# Patient Record
Sex: Female | Born: 1935 | Race: Black or African American | Hispanic: No | Smoking: Never smoker
Health system: Southern US, Community
[De-identification: ages and names within clinical notes are randomized; demographics above are authoritative.]

## PROBLEM LIST (undated history)

## (undated) DIAGNOSIS — N821 Other female urinary-genital tract fistulae: Secondary | ICD-10-CM

## (undated) DIAGNOSIS — R1310 Dysphagia, unspecified: Secondary | ICD-10-CM

## (undated) DIAGNOSIS — K3533 Acute appendicitis with perforation and localized peritonitis, with abscess: Secondary | ICD-10-CM

## (undated) DIAGNOSIS — E1139 Type 2 diabetes mellitus with other diabetic ophthalmic complication: Secondary | ICD-10-CM

## (undated) DIAGNOSIS — E1149 Type 2 diabetes mellitus with other diabetic neurological complication: Secondary | ICD-10-CM

## (undated) DIAGNOSIS — S82209A Unspecified fracture of shaft of unspecified tibia, initial encounter for closed fracture: Secondary | ICD-10-CM

## (undated) DIAGNOSIS — N319 Neuromuscular dysfunction of bladder, unspecified: Secondary | ICD-10-CM

## (undated) DIAGNOSIS — K5989 Other specified functional intestinal disorders: Secondary | ICD-10-CM

## (undated) DIAGNOSIS — G35 Multiple sclerosis: Secondary | ICD-10-CM

## (undated) DIAGNOSIS — I1310 Hypertensive heart and chronic kidney disease without heart failure, with stage 1 through stage 4 chronic kidney disease, or unspecified chronic kidney disease: Secondary | ICD-10-CM

## (undated) DIAGNOSIS — K598 Other specified functional intestinal disorders: Secondary | ICD-10-CM

## (undated) DIAGNOSIS — N219 Calculus of lower urinary tract, unspecified: Secondary | ICD-10-CM

## (undated) DIAGNOSIS — N183 Chronic kidney disease, stage 3 unspecified: Secondary | ICD-10-CM

## (undated) DIAGNOSIS — S72009A Fracture of unspecified part of neck of unspecified femur, initial encounter for closed fracture: Secondary | ICD-10-CM

## (undated) DIAGNOSIS — Z86711 Personal history of pulmonary embolism: Secondary | ICD-10-CM

## (undated) DIAGNOSIS — G8220 Paraplegia, unspecified: Secondary | ICD-10-CM

## (undated) DIAGNOSIS — F22 Delusional disorders: Secondary | ICD-10-CM

## (undated) DIAGNOSIS — M3500 Sicca syndrome, unspecified: Secondary | ICD-10-CM

## (undated) HISTORY — DX: Other female urinary-genital tract fistulae: N82.1

## (undated) HISTORY — DX: Chronic kidney disease, stage 3 (moderate): N18.3

## (undated) HISTORY — DX: Chronic kidney disease, stage 3 unspecified: N18.30

## (undated) HISTORY — DX: Other specified functional intestinal disorders: K59.89

## (undated) HISTORY — DX: Dysphagia, unspecified: R13.10

## (undated) HISTORY — DX: Delusional disorders: F22

## (undated) HISTORY — DX: Neuromuscular dysfunction of bladder, unspecified: N31.9

## (undated) HISTORY — DX: Multiple sclerosis: G35

## (undated) HISTORY — DX: Fracture of unspecified part of neck of unspecified femur, initial encounter for closed fracture: S72.009A

## (undated) HISTORY — DX: Hypertensive heart and chronic kidney disease without heart failure, with stage 1 through stage 4 chronic kidney disease, or unspecified chronic kidney disease: I13.10

## (undated) HISTORY — DX: Other specified functional intestinal disorders: K59.8

## (undated) HISTORY — DX: Sjogren syndrome, unspecified: M35.00

## (undated) HISTORY — DX: Acute appendicitis with perforation, localized peritonitis, and gangrene, with abscess: K35.33

## (undated) HISTORY — DX: Paraplegia, unspecified: G82.20

## (undated) HISTORY — DX: Personal history of pulmonary embolism: Z86.711

## (undated) HISTORY — DX: Type 2 diabetes mellitus with other diabetic neurological complication: E11.49

## (undated) HISTORY — DX: Unspecified fracture of shaft of unspecified tibia, initial encounter for closed fracture: S82.209A

## (undated) HISTORY — DX: Type 2 diabetes mellitus with other diabetic ophthalmic complication: E11.39

## (undated) HISTORY — DX: Calculus of lower urinary tract, unspecified: N21.9

---

## 1999-08-27 ENCOUNTER — Encounter: Payer: Self-pay | Admitting: Internal Medicine

## 1999-08-27 ENCOUNTER — Ambulatory Visit (HOSPITAL_COMMUNITY): Admission: RE | Admit: 1999-08-27 | Discharge: 1999-08-27 | Payer: Self-pay | Admitting: Internal Medicine

## 1999-09-01 ENCOUNTER — Encounter: Admission: RE | Admit: 1999-09-01 | Discharge: 1999-11-30 | Payer: Self-pay | Admitting: Orthopedic Surgery

## 1999-09-11 ENCOUNTER — Encounter: Payer: Self-pay | Admitting: Urology

## 1999-09-11 ENCOUNTER — Encounter: Admission: RE | Admit: 1999-09-11 | Discharge: 1999-09-11 | Payer: Self-pay | Admitting: Urology

## 2000-08-23 ENCOUNTER — Encounter: Admission: RE | Admit: 2000-08-23 | Discharge: 2000-09-06 | Payer: Self-pay | Admitting: Neurology

## 2000-08-27 ENCOUNTER — Ambulatory Visit (HOSPITAL_COMMUNITY): Admission: RE | Admit: 2000-08-27 | Discharge: 2000-08-27 | Payer: Self-pay | Admitting: Internal Medicine

## 2000-08-27 ENCOUNTER — Encounter: Payer: Self-pay | Admitting: Internal Medicine

## 2001-04-01 ENCOUNTER — Encounter: Admission: RE | Admit: 2001-04-01 | Discharge: 2001-04-01 | Payer: Self-pay | Admitting: Internal Medicine

## 2001-04-07 ENCOUNTER — Encounter: Admission: RE | Admit: 2001-04-07 | Discharge: 2001-04-07 | Payer: Self-pay | Admitting: Internal Medicine

## 2001-05-06 ENCOUNTER — Encounter: Admission: RE | Admit: 2001-05-06 | Discharge: 2001-05-06 | Payer: Self-pay | Admitting: Internal Medicine

## 2001-05-24 ENCOUNTER — Encounter: Admission: RE | Admit: 2001-05-24 | Discharge: 2001-05-24 | Payer: Self-pay | Admitting: Internal Medicine

## 2001-06-27 ENCOUNTER — Encounter (HOSPITAL_COMMUNITY): Admission: RE | Admit: 2001-06-27 | Discharge: 2001-09-25 | Payer: Self-pay | Admitting: Internal Medicine

## 2001-09-08 ENCOUNTER — Encounter: Payer: Self-pay | Admitting: Urology

## 2001-09-08 ENCOUNTER — Ambulatory Visit (HOSPITAL_COMMUNITY): Admission: RE | Admit: 2001-09-08 | Discharge: 2001-09-08 | Payer: Self-pay | Admitting: Internal Medicine

## 2002-09-20 ENCOUNTER — Encounter: Payer: Self-pay | Admitting: Internal Medicine

## 2002-09-20 ENCOUNTER — Ambulatory Visit (HOSPITAL_COMMUNITY): Admission: RE | Admit: 2002-09-20 | Discharge: 2002-09-20 | Payer: Self-pay | Admitting: Internal Medicine

## 2002-10-15 ENCOUNTER — Encounter: Payer: Self-pay | Admitting: Emergency Medicine

## 2002-10-16 ENCOUNTER — Inpatient Hospital Stay (HOSPITAL_COMMUNITY): Admission: EM | Admit: 2002-10-16 | Discharge: 2002-10-20 | Payer: Self-pay | Admitting: Emergency Medicine

## 2003-02-05 ENCOUNTER — Encounter (HOSPITAL_COMMUNITY): Admission: RE | Admit: 2003-02-05 | Discharge: 2003-05-06 | Payer: Self-pay | Admitting: Internal Medicine

## 2003-06-05 ENCOUNTER — Ambulatory Visit (HOSPITAL_COMMUNITY): Admission: RE | Admit: 2003-06-05 | Discharge: 2003-06-05 | Payer: Self-pay | Admitting: General Surgery

## 2003-06-05 ENCOUNTER — Encounter: Admission: RE | Admit: 2003-06-05 | Discharge: 2003-06-05 | Payer: Self-pay | Admitting: General Surgery

## 2003-06-20 ENCOUNTER — Ambulatory Visit (HOSPITAL_COMMUNITY): Admission: RE | Admit: 2003-06-20 | Discharge: 2003-06-20 | Payer: Self-pay | Admitting: Internal Medicine

## 2004-04-23 ENCOUNTER — Ambulatory Visit (HOSPITAL_COMMUNITY): Admission: RE | Admit: 2004-04-23 | Discharge: 2004-04-23 | Payer: Self-pay | Admitting: Internal Medicine

## 2004-06-14 ENCOUNTER — Inpatient Hospital Stay (HOSPITAL_COMMUNITY): Admission: EM | Admit: 2004-06-14 | Discharge: 2004-06-14 | Payer: Self-pay

## 2004-10-05 ENCOUNTER — Emergency Department (HOSPITAL_COMMUNITY): Admission: EM | Admit: 2004-10-05 | Discharge: 2004-10-05 | Payer: Self-pay | Admitting: Emergency Medicine

## 2004-10-06 ENCOUNTER — Inpatient Hospital Stay (HOSPITAL_COMMUNITY): Admission: EM | Admit: 2004-10-06 | Discharge: 2004-10-14 | Payer: Self-pay | Admitting: Emergency Medicine

## 2004-10-21 ENCOUNTER — Inpatient Hospital Stay (HOSPITAL_COMMUNITY): Admission: AD | Admit: 2004-10-21 | Discharge: 2004-10-27 | Payer: Self-pay | Admitting: Internal Medicine

## 2004-10-22 ENCOUNTER — Encounter: Payer: Self-pay | Admitting: Cardiology

## 2004-10-22 ENCOUNTER — Ambulatory Visit: Payer: Self-pay | Admitting: Cardiology

## 2005-03-06 ENCOUNTER — Ambulatory Visit (HOSPITAL_COMMUNITY): Admission: RE | Admit: 2005-03-06 | Discharge: 2005-03-06 | Payer: Self-pay | Admitting: Internal Medicine

## 2007-10-28 ENCOUNTER — Inpatient Hospital Stay (HOSPITAL_COMMUNITY): Admission: EM | Admit: 2007-10-28 | Discharge: 2007-11-03 | Payer: Self-pay | Admitting: Emergency Medicine

## 2007-11-02 ENCOUNTER — Encounter: Payer: Self-pay | Admitting: Gastroenterology

## 2007-11-09 ENCOUNTER — Encounter: Payer: Self-pay | Admitting: Gastroenterology

## 2007-11-29 ENCOUNTER — Ambulatory Visit (HOSPITAL_COMMUNITY): Admission: RE | Admit: 2007-11-29 | Discharge: 2007-11-29 | Payer: Self-pay | Admitting: *Deleted

## 2007-11-29 ENCOUNTER — Ambulatory Visit: Payer: Self-pay | Admitting: Vascular Surgery

## 2007-12-01 ENCOUNTER — Emergency Department (HOSPITAL_COMMUNITY): Admission: EM | Admit: 2007-12-01 | Discharge: 2007-12-01 | Payer: Self-pay | Admitting: Emergency Medicine

## 2007-12-18 ENCOUNTER — Emergency Department (HOSPITAL_COMMUNITY): Admission: EM | Admit: 2007-12-18 | Discharge: 2007-12-18 | Payer: Self-pay | Admitting: Emergency Medicine

## 2008-05-26 ENCOUNTER — Inpatient Hospital Stay (HOSPITAL_COMMUNITY): Admission: EM | Admit: 2008-05-26 | Discharge: 2008-05-28 | Payer: Self-pay | Admitting: Emergency Medicine

## 2008-05-28 ENCOUNTER — Encounter: Payer: Self-pay | Admitting: Gastroenterology

## 2009-03-06 ENCOUNTER — Encounter: Payer: Self-pay | Admitting: Gastroenterology

## 2009-03-07 ENCOUNTER — Encounter: Payer: Self-pay | Admitting: Gastroenterology

## 2009-03-20 ENCOUNTER — Encounter: Payer: Self-pay | Admitting: Gastroenterology

## 2009-04-10 ENCOUNTER — Encounter: Payer: Self-pay | Admitting: Gastroenterology

## 2009-04-11 ENCOUNTER — Encounter: Payer: Self-pay | Admitting: Gastroenterology

## 2009-04-15 ENCOUNTER — Ambulatory Visit (HOSPITAL_COMMUNITY): Admission: RE | Admit: 2009-04-15 | Discharge: 2009-04-15 | Payer: Self-pay | Admitting: Internal Medicine

## 2009-04-15 ENCOUNTER — Encounter: Payer: Self-pay | Admitting: Gastroenterology

## 2009-04-15 ENCOUNTER — Encounter (INDEPENDENT_AMBULATORY_CARE_PROVIDER_SITE_OTHER): Payer: Self-pay | Admitting: *Deleted

## 2009-04-19 ENCOUNTER — Ambulatory Visit: Payer: Self-pay | Admitting: Gastroenterology

## 2009-04-19 DIAGNOSIS — I6350 Cerebral infarction due to unspecified occlusion or stenosis of unspecified cerebral artery: Secondary | ICD-10-CM | POA: Insufficient documentation

## 2009-04-19 DIAGNOSIS — G35 Multiple sclerosis: Secondary | ICD-10-CM

## 2009-04-19 DIAGNOSIS — K315 Obstruction of duodenum: Secondary | ICD-10-CM | POA: Insufficient documentation

## 2009-05-27 ENCOUNTER — Ambulatory Visit: Payer: Self-pay | Admitting: Gastroenterology

## 2009-05-27 DIAGNOSIS — R1084 Generalized abdominal pain: Secondary | ICD-10-CM | POA: Insufficient documentation

## 2009-12-15 IMAGING — CR DG CHEST 1V
1 series · 1 of 1 positions shown · non-contrast
Comparison: 10/25/2004

CLINICAL DATA: Short of breath and cough

CHEST - 1 VIEW

[view not recorded]
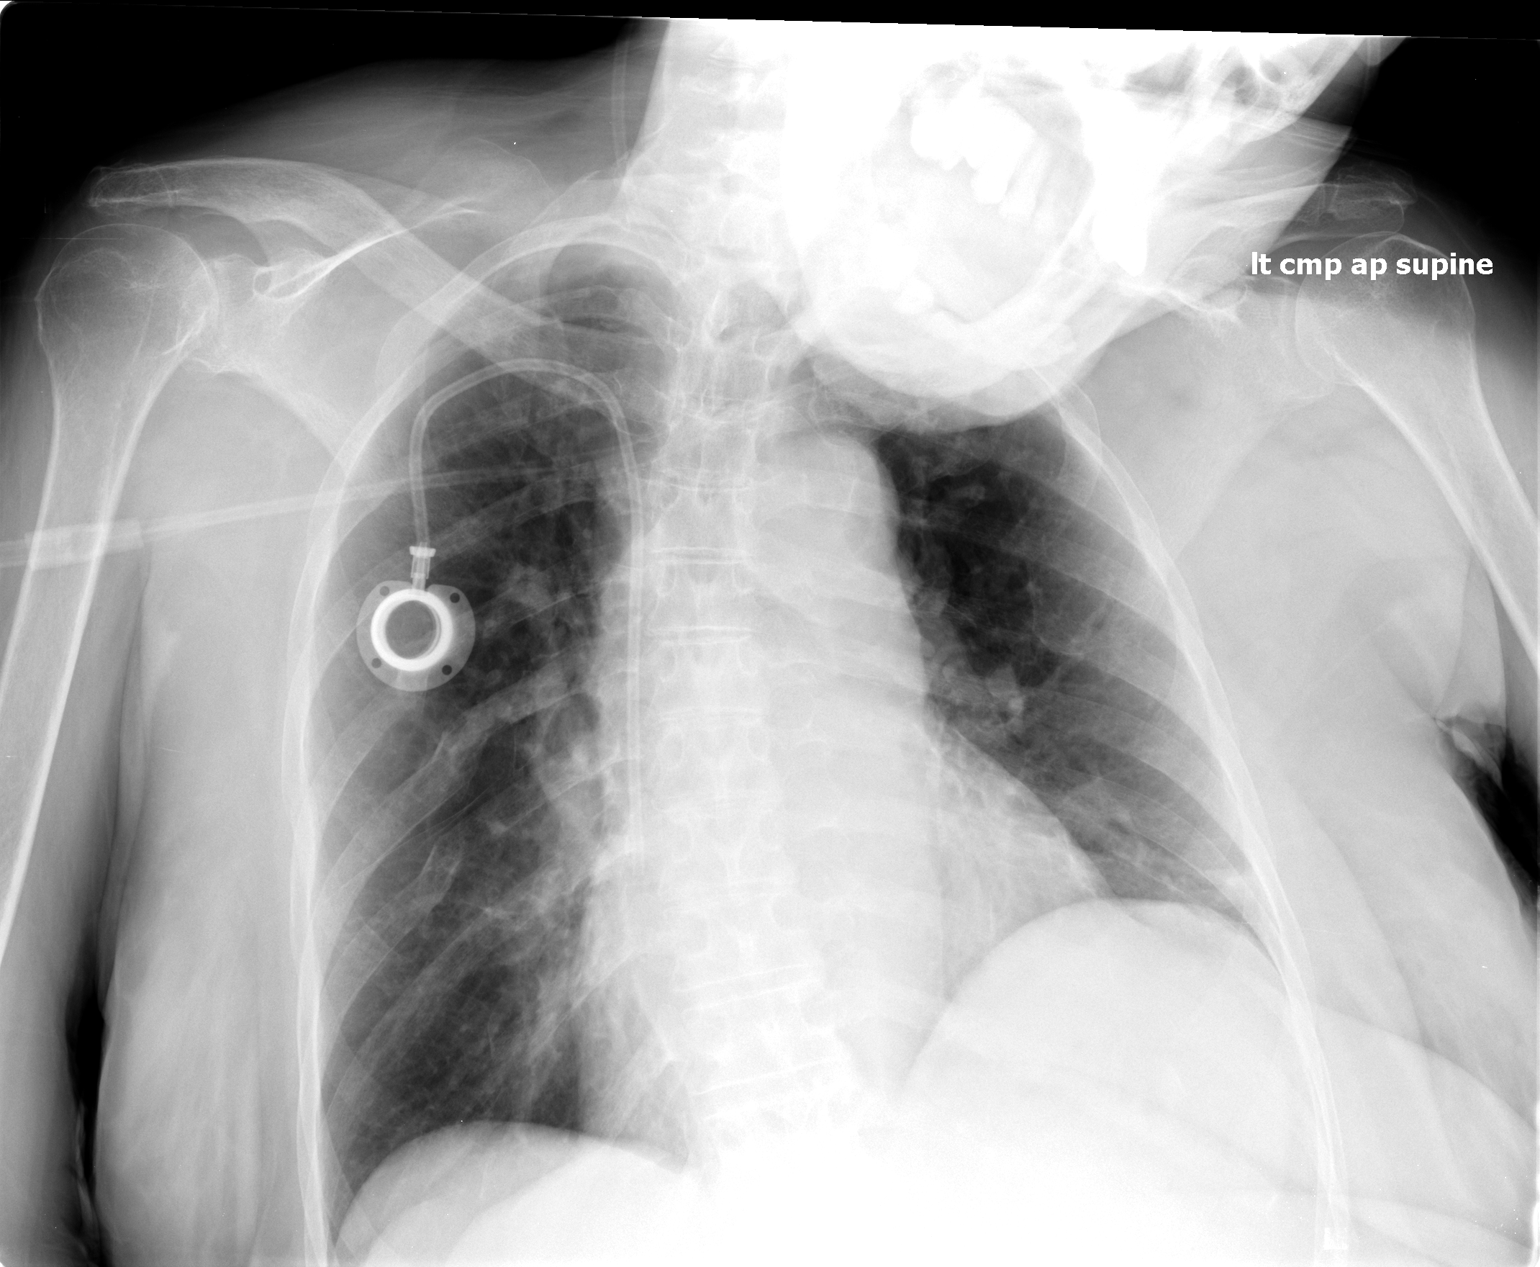

[1 of 1 positions shown; findings below may reference images not displayed]

FINDINGS: There is stable positioning of the right chest Port-A-
Cath.  Atelectasis/scarring present at both lung bases.  No overt
infiltrate is seen.  No evidence of pulmonary edema.  Chronic
elevation of the left hemidiaphragm.  No pleural effusions are
visualized.
IMPRESSION: Bibasilar scarring/atelectasis.  No focal infiltrate is identified.

## 2010-05-04 ENCOUNTER — Emergency Department (HOSPITAL_COMMUNITY): Admission: EM | Admit: 2010-05-04 | Discharge: 2010-05-04 | Payer: Self-pay | Admitting: Emergency Medicine

## 2010-10-08 LAB — DIFFERENTIAL
Basophils Absolute: 0 10*3/uL (ref 0.0–0.1)
Basophils Relative: 1 % (ref 0–1)
Eosinophils Relative: 4 % (ref 0–5)
Lymphocytes Relative: 30 % (ref 12–46)
Monocytes Absolute: 0.7 10*3/uL (ref 0.1–1.0)
Neutro Abs: 4.7 10*3/uL (ref 1.7–7.7)

## 2010-10-08 LAB — URINE CULTURE
Colony Count: >=100000
Culture  Setup Time: 201110092346

## 2010-10-08 LAB — COMPREHENSIVE METABOLIC PANEL
AST: 28 U/L (ref 0–37)
Albumin: 3.1 g/dL — ABNORMAL LOW (ref 3.5–5.2)
Alkaline Phosphatase: 69 U/L (ref 39–117)
BUN: 8 mg/dL (ref 6–23)
CO2: 25 mEq/L (ref 19–32)
Chloride: 104 mEq/L (ref 96–112)
Creatinine, Ser: 0.47 mg/dL (ref 0.4–1.2)
GFR calc non Af Amer: >60 mL/min (ref >60)
Potassium: 3.9 mEq/L (ref 3.5–5.1)
Total Bilirubin: 0.3 mg/dL (ref 0.3–1.2)

## 2010-10-08 LAB — CBC
HCT: 39.3 % (ref 36.0–46.0)
Hemoglobin: 12.6 g/dL (ref 12.0–15.0)
MCH: 26.3 pg (ref 26.0–34.0)
MCV: 82 fL (ref 78.0–100.0)
RBC: 4.79 MIL/uL (ref 3.87–5.11)
WBC: 8.2 10*3/uL (ref 4.0–10.5)

## 2010-10-08 LAB — URINE MICROSCOPIC-ADD ON

## 2010-10-08 LAB — URINALYSIS, ROUTINE W REFLEX MICROSCOPIC
Bilirubin Urine: NEGATIVE
Glucose, UA: NEGATIVE mg/dL
Ketones, ur: NEGATIVE mg/dL
Protein, ur: NEGATIVE mg/dL
pH: 6.5 (ref 5.0–8.0)

## 2010-10-08 LAB — PROTIME-INR: Prothrombin Time: 19.1 seconds — ABNORMAL HIGH (ref 11.6–15.2)

## 2010-12-09 NOTE — H&P (Signed)
NAMESTEPHANIE, [name] NO.:  [id number]   MEDICAL RECORD NO.:  [id number]          PATIENT TYPE:  EMS   LOCATION:  MAJO                         FACILITY:  [facility]   PHYSICIAN:  Eduard Clos, MDDATE OF BIRTH:  24-Jul-1936   DATE OF ADMISSION:  10/27/2007  DATE OF DISCHARGE:                              HISTORY & PHYSICAL   CHIEF COMPLAINT:  Fever.   HISTORY OF PRESENT ILLNESS:  A 75 year old female with known history of  multiple sclerosis, was brought into the ER.  The patient was having  fever the last 2-3 days, and the patient was examined and was felt by  the doctors at her facility that she might have pneumonia and was  transferred here for extended time in the hospital did not show any  acute infiltrates, and the patient's UA was compatible with ear  infection, and thus was admitted for further workup and management.   The patient denies any chest pain, shortness of breath, dizziness.  Denies any abdominal pain, nausea, vomiting, no diarrhea.  The patient  did have some sputum for which she was given enema in the facility she  is from.  Denies any abdominal pain.   PAST MEDICAL HISTORY:  Multiple sclerosis and chronic pain.   PAST SURGICAL HISTORY:  None.   MEDICATIONS PRIOR TO ADMISSION:  1. Baclofen 10 mg p.o. three times a day.  2. Fluoxetine 20 mg per day.  3. Lidoderm 5% patch to the lower right knee.  4. Lasix 40 mg p.o. daily.  5. K-Ciel 20 mEq p.o. daily.  6. Tylenol as needed.  7. Coumadin for DVT prophylaxis per pharmacy.   ALLERGIES:  SULFA.   FAMILY HISTORY:  Noncontributory.   SOCIAL HISTORY:  The patient lives in [place].  Denies smoking cigarettes,  drinking alcohol or using any illegal drugs.   REVIEW OF SYSTEMS:  As per history of present illness, otherwise,  nothing significant.   PHYSICAL EXAMINATION:  Patient examined at bedside, is in no acute  distress.  VITAL SIGNS:  Blood pressure 106/73, pulse 90 per minute,  temperature  max 101.4, respirations 18, O2 saturation 98%.  HEENT:  Nonicteric, no pallor.  CHEST:  Bilateral air entry present.  No rhonchi.  No crepitus.  HEART: S1 S2 heard.  ABDOMEN:  Slightly distended.  Nontender.  Bowel sounds heard.  No  guarding or rigidity.  CNS:  Alert and oriented to time, place and person.   The patient has complete hemiplegia of the right side with mild weakness  at the left side.  On both of the lower extremities, the right has  hardly any movement, and the left has minimal movements.  The patient  has chronic indwelling catheter.  EXTREMITIES:  Peripheral pulses felt.  No edema.   LABORATORY DATA:  Chest x-ray:  Bibasilar scarring.  No infiltrates  identified.   CBC:  WBC is 19.7, hemoglobin 12.6, hematocrit 37, platelets 204,  neutrophils 88%.  PT/INR 32.4 and 3.  Basic metabolic panel:  Sodium  137, potassium 4.2, chloride 103, glucose 102, BUN 12, creatinine 0.9.  Urinalysis:  Appearance is cloudy with blood,  protein 100, nitrites  positive, leukocytes large.  WBC 11-12, bacteria many.   ASSESSMENT:  1. Fever with possible sepsis secondary to UTI.  2. Multiple sclerosis.  3. Mild dehydration.  4. Chronic pain.   PLAN:  1. Admit patient to medical floor.  Start empiric antibiotics.  Follow      blood cultures and urine cultures.  Placing on patient's home      medications.  2. Other recommendations, as patient's condition evolves.      Eduard Clos, MD  Electronically Signed     ANK/MEDQ  D:  10/28/2007  T:  10/28/2007  Job:  [id number]

## 2010-12-09 NOTE — Discharge Summary (Signed)
NAMEDONIE, [name] NO.:  [id number]   MEDICAL RECORD NO.:  [id number]          PATIENT TYPE:  INP   LOCATION:  5531                         FACILITY:  [facility]   PHYSICIAN:  Hillery Aldo, M.D.   DATE OF BIRTH:  1935/11/03   DATE OF ADMISSION:  05/26/2008  DATE OF DISCHARGE:  05/28/2008                               DISCHARGE SUMMARY   PRIMARY CARE PHYSICIAN:  Dr. Jacalyn Lefevre with [facility].   DISCHARGE DIAGNOSES:  1. Febrile illness.  2. Polymicrobial urinary tract infection.  3. Multiple sclerosis.  4. Quadriparesis.  5. Hypotension, resolved.  6. Mild hypokalemia.  7. Leukocytosis.   DISCHARGE MEDICATIONS:  1. Ceftin 500 mg p.o. b.i.d. times 5 days.  2. Oxybutynin 5 mg q.a.c. hours p.o.  3. Protonix 40 mg p.o. daily.  4. Potassium ER 20 mEq p.o. daily.  5. Coumadin 4.5 mg p.o. daily.  6. Multivitamin p.o. daily.  7. Med Pass 60 mL t.i.d.  8. Refresh eye drops one drop in each eye q.h.s.   CONSULTATIONS:  None.   BRIEF ADMISSION HISTORY OF PRESENT ILLNESS:  The patient is a 75-year-  old female sent from her nursing department because of fever.  The  patient her self had no complaints and was unsure why she was sent to  the emergency department.  Upon initial evaluation, she was febrile and  had a leukocytosis and therefore was admitted for further evaluation and  workup.  For full details, please see the dictated report done by Dr.  Gasper Sells.   PROCEDURES AND DIAGNOSTIC STUDIES:  Chest x-ray on May 26, 2008,  showed a stable appearance with no evidence of acute cardiopulmonary  disease.  Chronic elevation of left hemidiaphragm and mild left basilar  atelectasis or left basilar scarring.   DISCHARGE LABORATORY VALUES:  Sodium was 138, potassium 4.4, chloride  110, bicarb 22, BUN 7, creatinine 0.5, glucose 84.  White blood cell  count was 10.9, hemoglobin 10.9, hematocrit 32.0, platelets 206.   HOSPITAL COURSE:  1. Febrile  illness.  The patient did have blood cultures drawn and one      of the two cultures was positive for coagulase negative staph.      This was felt to be a contaminant in that the other culture was      negative.  While pending the culture results, the patient was      transiently put on vancomycin.  Felt that her fever was probably      due to her urinary tract infection.  The patient had defervesced      over the past 24 hours and is now complaining of some mild upper      respiratory symptoms and she may have a concurrent viral upper      respiratory infection.  This is currently mild and is not      consistent with influenza, and therefore, Tamiflu has not been      started.  2. Polymicrobial urinary tract infection.  The patient's urine      cultures grew out multiple bacterial morphotypes.  Currently on day      two of Rocephin and we will discharge her on an additional 5 days      of treatment with Ceftin.  She should have her urine cultures      repeated after completion of antibiotics.  3. Multiple sclerosis with quadriparesis.  The patient is at high risk      for skin breakdown and problems related to her relative immobility.      Her skin was examined closely and no signs of decubitus ulcers were      found.  She did have some mild erythema to the sacral area, and we      do recommend ongoing efforts at pressure reduction.  4. Hypotension.  The patient was hydrated and put on empiric      antibiotic therapy.  At this time, her discharge blood pressure is      128/73.  5. Mild hypokalemia.  The patient was appropriately repleted and her      discharge potassium is 4.4.  6. Leukocytosis.  The patient's admission white blood cell count was      18.6.  It is currently 10.9.   DISPOSITION:  The patient feels well and is without complaints other  than some mild rhinorrhea.  She denies pharyngitis and arthralgias.  It  is felt that her fever and hypotension were likely due to be  urinary  tract infection, and she will be discharged on an additional 5 days of  treatment with Ceftin.  She should follow up with her primary care  physician after completion of antibiotics for repeat urinalysis with  culture.   Time spent coordinating care for discharge equals 30 minutes.      Hillery Aldo, M.D.  Electronically Signed     CR/MEDQ  D:  05/28/2008  T:  05/28/2008  Job:  [id number]   cc:   Frederica Kuster, MD

## 2010-12-09 NOTE — Discharge Summary (Signed)
[name], [name] NO.:  [id number]   MEDICAL RECORD NO.:  [id number]          PATIENT TYPE:  INP   LOCATION:  6703                         FACILITY:  [facility]   PHYSICIAN:  Herbie Saxon, MDDATE OF BIRTH:  Apr 27, 1936   DATE OF ADMISSION:  10/28/2007  DATE OF DISCHARGE:  11/03/2007                               DISCHARGE SUMMARY   ADDENDUM   This is an addendum to the Discharge Summary previously dictated by Dr.  Jerolyn Center on November 01, 2007.   DISPOSITION:  [facility].   FINAL DIAGNOSES:  Remains the same.   DISCHARGE MEDICATIONS:  Please add:   Rocephin 1 g IV daily for five more days.  The patient is to have a  repeat complete blood count November 04, 2007 at the nursing facility.  She  is to have PT/INR monitored weekly for two weeks.  The goal INR should  be 2-3 for DVT prophylaxis.  The primary care doctor at [facility]  nursing home to titrate her PT and INR as outpatient and watch for  bleeding episodes.  The patient will also be on strict comatose  precautions, air mattress.   PHYSICAL EXAMINATION:  GENERAL:  She is an elderly lady not in acute  respiratory distress.  She is anxious, quadriparetic with pupils equal,  reactive to light accommodation; involuntary and incongruent speech,  mildly pale, not jaundiced.  NECK:  Supple.  She has quadriparesis.  CHEST:  Clear.  HEART:  Heart sounds 1 and 2, regular rate and rhythm.  ABDOMEN:  Benign.  NEUROLOGICAL:  She is alert, demented.  EXTREMITIES:  Contractures of her lower limbs.  Peripheral pulses  present, no pedal edema.   LABORATORY DATA:  WBC 13.5, hematocrit 31, platelet count 352.  PPT 26,  INR 2.3.   DISCHARGE CONDITION:  Stable.   She is to have a position change in bed every 2-3 hourly.  Patient is to  have a repeat urinalysis in five days' time.      Herbie Saxon, MD  Electronically Signed     MIO/MEDQ  D:  11/03/2007  T:  11/03/2007  Job:  [id number]

## 2010-12-09 NOTE — Discharge Summary (Signed)
[name], [name] NO.:  [id number]   MEDICAL RECORD NO.:  [id number]          PATIENT TYPE:  INP   LOCATION:  6703                         FACILITY:  [facility]   PHYSICIAN:  Altha Harm, MDDATE OF BIRTH:  [birth year]   DATE OF ADMISSION:  10/28/2007  DATE OF DISCHARGE:  11/02/2007                               DISCHARGE SUMMARY   DISCHARGE DISPOSITION:  Nursing home.   FINAL DISCHARGE DIAGNOSES:  1. Urinary tract infection with sepsis.  2. Providencia stuartii in urine.  3. Multiple sclerosis  4. Chronic pain.  5. Effective quadriparesis.  6. Neurogenic bladder.  7. Left torticollis.  8  Chronic suprapubic catheter.  1. Chronic ileus.  2. History of  left hip fracture.  3. History of right tibia fracture.  4. Paranoia.  5. Gastroesophageal reflux disease.  6. History of chronic depression.  7. Status post cath placement on the right side.   DISCHARGE MEDICATIONS:  1. Baclofen 10 mg p.o. t.i.d.  2. Prozac 20 mg p.o. daily.  3. Lasix 40 mg p.o. daily.  4. Neurontin 200 mg p.o. q.h.s.  5. Multivitamin 1 tablet p.o. daily.  6. Ditropan 5 mg p.o. q.8 h.  7. Protonix 40 mg p.o. daily.  8. Potassium chloride 10 mEq p.o. daily.  9. Lidocaine 5% transdermal patch applied daily.  10.Ensure 1 can p.o. t.i.d. p.r.n.  11.Lactulose 30 mL p.o. daily.  12.Coumadin to 1 mg p.o. daily.   CONSULTANTS:  None.   PROCEDURES:  None.   DIAGNOSTIC STUDIES:  Chest x-ray one view done on admission which shows  bibasilar scarring or atelectasis.  No focal infiltrate.   PERTINENT LABORATORY STUDIES:  Urinary culture shows Providencia  stuartii which is highly resistant, sensitive only to ceftriaxone,  Zosyn,aztreonam, imipenem, ceftazidime.   CHIEF COMPLAINT:  Change in mental status and fever.   HISTORY OF PRESENT ILLNESS:  Please see the H&P dictated by Dr.  Toniann Fail, for details of the HPI.   HOSPITAL COURSE:  1. The patient was admitted and started on  ciprofloxacin and      vancomycin, for her urinary tract infection.  She was continued on      this, until sensitivities were received.  Sensitivity showed that      this was sensitive to neither medication, and the patient was      switched over ceftriaxone.  The patient is to continue a total of 9      days of ceftriaxone, to complete a full course of 10 days for UTI      with pyelonephritis caused by Providencia stuartii.  2. Indwelling suprapubic catheter.  The patient had leakage around her      suprapubic catheter.  Attempts were made to treat it with Ditropan;      however, these were unsuccessful.  The catheter was replaced, which      caused some transient hematuria.  The new catheter was irrigated,      and the patient continued to have a clear flow of urine.  3. Chronic Coumadin therapy.  The patient in on chronic Coumadin  therapy, due to her immobility.  Her INR was elevated today at 3.7.      Coumadin will be held tonight and will check in INR in the morning.      The discharging physician should make recommendations on the dose      of Coumadin that the patient should take.  Otherwise, the patient      is continued on her usual medications.  She is otherwise stable, to      be transferred back to the nursing home in the morning.   PHYSICAL EXAMINATION:  Her temperature today was 98.5, heart rate 65,  blood pressure 111/68, respiratory rate of 60, O2 sat of 99% on 2  liters.  The patient has no complaints.  She is at her baseline, and  subsequently she will  patient transferred to the nursing home in the  morning.      Altha Harm, MD  Electronically Signed     MAM/MEDQ  D:  11/01/2007  T:  11/01/2007  Job:  [id number]   cc:   [facility]

## 2010-12-09 NOTE — H&P (Signed)
[name], Crosby                 ACCOUNT NO.:  [id number]   MEDICAL RECORD NO.:  [id number]          PATIENT TYPE:  INP   LOCATION:  5531                         FACILITY:  [facility]   PHYSICIAN:  Thomasenia Bottoms, MDDATE OF BIRTH:  1935-09-07   DATE OF ADMISSION:  05/26/2008  DATE OF DISCHARGE:                              HISTORY & PHYSICAL   CHIEF COMPLAINT:  Fever.   HISTORY OF PRESENT ILLNESS:  [name] is a 75 year old woman who was  sent in from the nursing home today because of a temperature of 102.  The patient says she felt fine and was not sure at all why she was sent  to the hospital, but this is per the ER records.  The patient is not  able to provide any other history.  Her past medical history is  significant for multiple sclerosis, which was diagnosed in the 73s.  She is currently bed bound as the effect of quadriparesis, history of  the decubitus ulcers.  She has a history of UTI and sepsis that was the  reason for last hospitalization here which was in April 2009.  She has a  history of GERD, depression, history of left hip fracture and right  tibial fracture, and history of chronic illness.  She has a suprapubic  catheter in place because of history of neurogenic bladder.  She also  has a Port-A-Cath in place and she is on chronic anticoagulation.  The  records in the computer say this is for DVT prophylaxis.  I do not see  any other indication listed.  Her medications on arrival include  warfarin 4.5 mg p.o. daily, multivitamin daily, Med Pass 60 mL three  times daily, and sorbitol 30 mL daily.  She gets Refresh eyedrops 1 drop  in each eye at night, potassium 20 mEq daily, pantoprazole 40 mg p.o.  daily, oxybutynin 5 mg every 8 hours.   SOCIAL HISTORY:  The patient does not smoke cigarettes, drink alcohol,  or use any illicit drugs.   FAMILY HISTORY:  Noncontributory.   REVIEW OF SYSTEMS:  The patient is not able to reliably provide vitals  on arrival  in the emergency department.  Her blood pressure was 83/60.  At the time of my evaluation, her systolic blood pressure was 105.  Her  initial pulse was 97, respiratory rate 20, O2 sats 95% on 2 liters nasal  cannula, and temperature on arrival here was 98.5.   PHYSICAL EXAMINATION:  GENERAL:  She is a chronically ill-appearing  woman in no acute distress.  She is pleasant and cooperative.  HEENT:  Normocephalic, atraumatic.  Pupils are equal in size and round.  Sclerae nonicteric.  Oral mucosa is quite dry.  NECK:  Supple.  No lymphadenopathy, no thyromegaly, no jugular venous  distention.  CARDIAC:  Regular rate and rhythm.  No murmurs, no gallops, no rubs.  LUNGS:  Clear to auscultation anteriorly.  No wheezes, no rhonchi, no  rales.  Good breath sounds.  ABDOMEN:  Distended but nontender.  She does have occasional breath  sounds.  No hepatosplenomegaly appreciated.  She does have a suprapubic  catheter.  She also has a right upper chest Port-A-Cath.  EXTREMITIES:  No evidence of pitting edema.  SKIN:  Intact.  No decubiti on her heels though her sacral area was not  examined.  NEUROLOGIC:  The patient is pleasant.  Her cranial nerves II through XII  are grossly intact.  She does not have movement of any of her  extremities except for some movement of the left upper extremity.  She  has bilateral foot drop and some muscle wasting in the calf area.  MUSCULOSKELETAL:  No effusions, but she has multiple contractures most  prominently in her right wrist and hand and elbow.  Please note that it  is her left arm that she is able to move voluntarily, not her right.   LABORATORY DATA:  The patient's sodium is 137, potassium 3.3, chloride  103, bicarb 26, glucose 109, BUN 15, creatinine 0.5.  Urinalysis reveals  turbid urine, large leukocyte esterase, negative nitrite, too numerous  to count wbc's, and 7-10 rbc's.  Her chest x-ray reveals no acute  cardiopulmonary disease, chronic left  hemidiaphragm elevation with mild  left basilar atelectasis.   ASSESSMENT/PLAN:  1. Urinary tract infection.  We will put the patient on ceftriaxone.      Her last hospitalization revealed a Providencia urinary tract      infection, which was resistant to Cipro and we will await her urine      culture.  2. Hypotension.  This has resolved with intravenous fluids in the      emergency department.  I suspect this is secondary to dehydration.      Given her clinical findings, we will put her on intravenous fluids      and follow.  3. Mild hypokalemia.  We will replace.  4. Otherwise we will continue her routine outpatient medications      including her daily Coumadin.  The records from several years ago      report that she was on Coumadin for deep vein thrombosis      prophylaxis.      Thomasenia Bottoms, MD  Electronically Signed     CVC/MEDQ  D:  05/26/2008  T:  05/26/2008  Job:  [id number]   cc:   Bertram Millard. Hyacinth Meeker, M.D.

## 2010-12-12 NOTE — Discharge Summary (Signed)
NAMEDELLE, ANDRZEJEWSKI NO.:  [id number]   MEDICAL RECORD NO.:  [id number]          PATIENT TYPE:  INP   LOCATION:  4735                         FACILITY:  [facility]   PHYSICIAN:  Lonia Blood, M.D.DATE OF BIRTH:  06/20/1936   DATE OF ADMISSION:  10/05/2004  DATE OF DISCHARGE:  10/14/2004                                 DISCHARGE SUMMARY   CHIEF COMPLAINT:  Shortness of breath.   DISCHARGE DIAGNOSES:  1.  Bilobar right lung pulmonary emboli.      1.  Computed tomography scan of the chest October 05, 2004, notes right          middle lobe and right upper lobe pulmonary emboli with bibasilar          atelectasis and ectatic thoracic aorta.      2.  Heparin to Coumadin transition at discharge.  INR actually          hypertherapeutic at 4.2.      3.  Nursing home pharmacy to follow Coumadin dosing and blood level.  2.  Pneumonia versus atelectasis.      1.  Chest x-ray, October 05, 2004, notes no acute abnormality with mild          peribronchial thickening and bibasilar scarring.      2.  Chest x-ray, October 08, 2004, notes interval worsening bilateral          basilar atelectasis and infiltrates.      3.  Chest x-ray, October 14, 2004, notes resolving bilateral lower lobe          infiltrates.      4.  Day #5 IV Zosyn, will transition to oral Augmentin to complete a          total 14-day course.  No obvious indication of aspiration as          indicated below.  3.  History of aspiration pneumonia/pneumonitis.  4.  History of gastroesophageal reflux disease.  5.  Sepsis per blood cultures.      1.  Blood cultures x2, October 08, 2004, both positive Staphylococcus          coagulase negative.      2.  Day #5 planned 14-day antibiotic therapy with vancomycin.  Nursing          home pharmacy to follow vancomycin dosing and blood levels.  Of          note, vancomycin trough mildly elevated 16.6 done on October 13, 2004.          Vancomycin dosing was subsequently changed  from twice daily to once          daily dosing.  Would recheck a trough level per discretion of the          following nursing home pharmacy.      3.  The patient also had positive blood cultures, June 14, 2004, x2          for Staphylococcus coagulase negative.  Suspicious existing Port-A-          Cath as  a possible source.  Will complete remaining 10 days of          vancomycin via Port-A-Cath.  Following completion of antibiotics          would draw two more blood cultures and followup.  If these remain          positive Staphylococcus coagulase negative I believe the patient          should have the Port-A-Cath removed.  6.  Chronic suprapubic catheter.      1.  Urine culture, October 08, 2004, produced no growth.  7.  Normocytic anemia.      1.  Hemoglobin in the upper 9 to 11 range over the course of          hospitalization with a likely component of hemodilution from IV          fluids.  No sign of acute bleed.      2.  One of three stool positive occult blood with no aggressive workup          initiated over the course of this hospitalization.      3.  Would recheck a CBC approximately two weeks postdischarge to follow.  8.  Hypokalemia, resolved over the course of hospitalization with potassium      replacement.  9.  End-stage multiple sclerosis.      1.  Affective quadriparesis.      2.  Chronic ileus.      3.  Neurogenic bladder with suprapubic catheter.      4.  History of neurogenic edema.      5.  History of torticollis.  10. Chronic constipation/ileus.      1.  Improved with use of p.r.n. Dulcolax suppository.  11. History of osteoporosis.      1.  Left hip fracture previously.      2.  Right tibial fracture previously.  12. History of paranoid ideation and likely baseline dementia.  13. Questionable history of osteomyelitis, right hip by history.  14. History of chronic depression.  15. Status post right chest Port-A-Cath.  16. Rule out dysphagia.      1.   Fiberoptic endoscopic evaluation of swallowing study, 10/14/04, notes          no aspiration and essentially normal swallowing function.  Some          delay in swallowing initiation.      2.  Recommended diet:  Dysphagia III with chopped meats and thin          liquids.  Full supervision and assist with feedings, meds whole in          applesauce or similar, eat and drink only when alert and seated          upright as much as possible.  Small bites and sips, chin tuck, turn          head to left during swallow which is the natural chronic head          position.  Reflux precautions with history of GERD.      3.  Speech therapy consult at the nursing home to implement dysphagia          diet and aspiration precautions as above.   ALLERGIES:  Sulfa.   CODE STATUS:  Currently full code.   DISCHARGE MEDICATIONS:  1.  Neurontin 100 mg daily a.m. and 200 mg daily at bedtime.  2.  Avonex 30 mcg IM  once weekly on Friday's.  3.  MiraLax 17 g powder with eight ounces of fluid once daily.  4.  Coumadin with nursing home pharmacy to follow dosing and blood level.      Goal INR 2.0-3.0 range.  Discharge INR 4.2.  5.  Vitamin C 500 mg twice daily.  6.  Potassium chloride 30 mEq twice daily.  7.  Prozac 40 mg daily.  8.  Baclofen 10 mg four times daily.  9.  Lithostat 250 mg three times daily.  10. Protonix 40 mg twice daily.  11. Augmentin 875 mg twice daily for an additional 10 days postdischarge.  12. Vancomycin 1250 mg IV via Port-A-Cath once daily for an additional 10      days postdischarge.  May wish to follow a repeat serum trough level.  13. Senokot S two tablets daily at bedtime as needed.  14. Enema of choice daily per rectum as needed.  15. Tylenol 650 mg every four hours as needed for mild pain or fever.  16. Dulcolax 10 mg suppository daily per rectum as needed.   SPECIAL DISCHARGE INSTRUCTIONS: 1.  Disposition:  Discharged back to [facility].  2.   Activity:  Up to chair as tolerated with full assistance.  3.  Discharge diet:  Will be dysphagia III with chopped meats and thin      liquids.  Aspiration precautions including full supervision and      assistance with feeding, meds given in applesauce or similar      consistency, eat and drink only when alert and seated as upright as      possible.  Small bites and sips, chin tuck, head turn to the left during      swallow which is the patient's natural chronic head position.  Reflux      precautions considering history of GERD.  4.  Speech therapy consult to implement dysphagia diet and aspiration      precautions as above.  5.  Maintain Port-A-Cath per facility protocol.  6.  Following completion of vancomycin the patient should have repeat blood      cultures x2.  If blood cultures are positive again for Staphylococcus      coagulase negative the patient will need to have her Port-A-Cath      removed.  7.  Maintain chronic suprapubic catheter per facility protocol.  8.  Repeat basic metabolic panel and CBC 1-2 weeks postdischarge.  Only to      followup on potassium level postdischarge considering ongoing potassium      supplementation.  9.  Follow bowel movement frequency for constipation problems.  Notify if no      bowel movement in 72 hours.  10. Initiate Debrox 5-10 drops in one ear and that ear should be elevated      for 30 minutes.  Following 30 minutes flush with warm water using a bulb      syringe to remove loosened wax.  Procedure should then be repeated on      the other ear both once daily for five days postdischarge.  If the      patient's complaints of decreased hearing persists after the five day      treatment she will need to go to a outpatient ENT appointment for      followup.  11. Considering current full code status would initiate ongoing discussions      with patient and family to possible change no code status  considering      age, frailty, multiple  comorbidity.   CONSULTATIONS:  Speech therapy.   PROCEDURES:  1.  Computed tomography scan of the chest, October 05, 2004, with results as      above.  2.  Fiberoptic endoscopic evaluation of swallowing study, October 14, 2004.   DISCHARGE LABORATORY:  Last hemoglobin and hematocrit, October 14, 2004, 9.7  and 59.1 respectively.  Last full CBC, October 12, 2004, WBC was 8.2.  PT/INR  done 10/14/2004, 29.5 and 4.2 respectively.  Basic metabolic panel, October 12, 2004, sodium 139, potassium 4.6, chloride 109, CO2 25, BUN 6, and creatinine  0.8.  Anemia panel inconclusive with iron 73, iron binding capacity low 203,  iron saturation 36, B12 greater than 2000, and RBC folate high 617.  Vancomycin trough done October 18, 2004, mildly elevated at 16.6.  Stool one  of three positive occult blood.  Urine and blood cultures as reported above.  Radiology as reported above.  HISTORY OF PRESENT ILLNESS:  This 75 year old female resident of [facility] followed there in primary care by Dr. Murray Hodgkins.  She has end-stage muscle sclerosis.  She began complaining of increased  shortness of breath.  Coarse airway sounds were noted.  This occurred quite  abruptly.  The patient has a history of aspiration and resulting  pneumonia/pneumonitis.  The concern was that the patient may have aspirated  and she was sent to [facility] Emergency Room for evaluation.  She denied  any pain, fevers, chills.  She denied nausea or vomiting.  A D-dimer was  obtained and this was found to be markedly elevated.  CT scan of the chest  was obtained and this indicated a bilobar right upper and middle lobe  pulmonary emboli.  The patient was admitted for further evaluation and  treatment.   For dictated past medical history, admission medications, review of systems,  family history, social history, physical exam, laboratory data, impression,  and plans dictated in the admission history and physical dated  October 05, 2004.   HOSPITAL COURSE:  Problem 1. Bilobar right pulmonary emboli.  The patient  presented as per history of present illness.  Again, a CT scan was done in  the emergency room and this did indicate right middle lobe and right upper  lobe pulmonary emboli.  The patient was dyspneic and hypoxic on room air.  She did require high flow oxygen support initially with 15 L and face mask  oxygen.  She is also febrile with leukocytosis.  She was initiated on  heparin and then transitioned to Coumadin.  Pharmacy followed dosing and  blood level.  INR became therapeutic and heparin was discontinued.  The  patient's complaints of dyspnea improved over the course of hospitalization  and she was transitioned to nasal cannula oxygen currently on 2 L.  She  complains of no further shortness of breath.  INR has climbed to a level of  4.2  prior to discharge and Coumadin is on hold.  Postdischarge the  patient's PT and INR should be followed by the nursing home pharmacy and her  Coumadin dose by pharmacy.  Goal INR 2.0-3.0 range.   Problem 2. Pneumonia versus atelectasis.  The patient did have a chest x-ray  at the time of admission indicating only some peribronchial thickening, but  no acute abnormality.  Again, she was febrile initially with leukocytosis.  Repeat chest x-ray noted October 08, 2004 did indicate worsening bilateral  basilar atelectasis and infiltrates.  The patient was placed on Zosyn  antibiotic therapy at the time of admission.  This was continued and is now  day #5.  Chest x-ray on October 10, 2004, notes a slight improvement,  bilateral lower lobe air space disease, and final chest x-ray, October 14, 2004, notes resolving bilateral lower lobe infiltrates.  It was unclear if  this actually represented pneumonia or atelectasis, but will complete a  total 14-day course of antibiotic therapy with transition to Augmentin at discharge.  At discharge the patient's respiratory status  is stable.  Leukocytosis has resolved per follow-up labs and the patient has been  afebrile for several days.  She was evaluated for dysphagia with a FEES  study as indicated below.   Problem 3. Sepsis with two blood culture positive Staphylococcus coagulase  negative.  Blood cultures were obtained at the time of admission and both of  these cultures again positive growth Staphylococcus coagulase negative.  The  patient was empirically started on vancomycin therapy.  It is noted that  blood cultures were obtained June 14, 2004 during a previous  hospitalization and two blood cultures here were also positive for  Staphylococcus coagulase negative.  The plan currently will be to complete a  total 14-day course of antibiotic therapy.  The patient has a right chest  Port-A-Cath placed previously.  I have asked that the nursing home  administer the vancomycin through the Port-A-Cath to complete the course.  We will check that the catheter is functional prior to hospital discharge.  Following completion of a 14-day course of antibiotic therapy with  vancomycin the patient should have two additional blood cultures drawn.  If  these again show a positive growth Staphylococcus coagulase negative I  believe the Port-A-Cath should be presumed as the likely source and  subsequently removed.  Pharmacy followed the vancomycin dosing over the  course of hospitalization and a serum trough level was drawn October 13, 2004.  This was elevated at 16.6.  Vancomycin dosing was subsequently decreased  from twice daily to once daily dosing as above.  Nursing home pharmacy  should follow the vancomycin dosing and blood level.  Would suggest a repeat  trough level at some point postdischarge.   Problem 4. Chronic suprapubic catheter.  The patient does have a history of  neurogenic bladder.  The urine was cultured and this produced no growth.  The catheter was found by nursing staff to be out of the patient  with bulb  deflated.  This was replaced during the course of hospitalization and has  functioned appropriately.  The catheter should be followed postdischarge by  the facility per protocol.   Problem 5. Normocytic anemia.  The patient's hemoglobin at the time of  admission was 11.4, but she was somewhat dehydrated with IV fluids and  likely delusional component.  Hemoglobin has stabilized in the mid-9 to mid-  10 range.  There has been no sign of acute bleed, but one of three stools  did check occult blood positive.  This was not aggressively investigated as  the hemoglobin again has remained stable.  Anemia panel was obtained and has  a mixed picture as below.  There has been no sign of acute bleed.  Would  simply follow a CBC in 1-2 weeks postdischarge considering Coumadin therapy.   Problem 6. Hypokalemia.  Serum potassium was low at the time of admission  3.3.  The patient was supplemented with potassium and prior to  hospital discharge the potassium was normalized at 4.6.  She remains on ongoing  potassium supplementation.  Current dosing may not need to be administered  indefinitely and would check a repeat basic metabolic panel approximately  one week postdischarge with special attention paid to some potassium level.   Problem 7. End-stage multiple sclerosis.  History as above and this remained  essentially stable over the course of hospitalization.   Problem 8. Chronic constipation/ileus.  This is a known history secondary to  the patient's multiple sclerosis.  Her bowel movement frequency was followed  closely and she did require Dulcolax suppository to produce bowel movement.  Would follow bowel movement frequency closely most discharge and notify if  no bowel movement in 72 hours.  If left adequate routine and p.r.n.  laxative, and suppository, as well as enema therapy to follow this patient  postdischarge.   Problem 9. Code status.  The patient currently a full code, but  would  continue discussions with the patient and family postdischarge for possible  no code status considering age, frailty, comorbidity.   DISPOSITION:  Discharged back to [facility], primary  care doctor, Dr. Murray Hodgkins.   ACTIVITY:  Up to chair as tolerated with full assistance.   DIET:  Dysphagia III with thin liquids and chopped meats.  Aspiration  precautions and speech therapy consult as above.   CONDITION AT DISCHARGE:  Stable/improved, though frail with multiple medical  problems as above.      TC/MEDQ  D:  10/14/2004  T:  10/14/2004  Job:  [id number]

## 2010-12-12 NOTE — Op Note (Signed)
NAME:  [name], BRILL                           ACCOUNT NO.:  [id number]   MEDICAL RECORD NO.:  [id number]                   PATIENT TYPE:  INP   LOCATION:  0479                                 FACILITY:  [facility]   PHYSICIAN:  Mark C. Ophelia Charter, M.D.                 DATE OF BIRTH:  July 01, 1936   DATE OF PROCEDURE:  10/16/2002  DATE OF DISCHARGE:                                 OPERATIVE REPORT   PREOPERATIVE DIAGNOSES:  1. Right tibial plateau fracture with shaft extension.  2. Right impacted femoral neck fracture.   PROCEDURE:  Open reduction internal fixation right tibial plateau,  percutaneous reduction and pinning right femoral neck fracture.   SURGEON:  Mark C. Ophelia Charter, M.D.   ANESTHESIA:  GOT.   TOURNIQUET TIME:  58 minutes.   DRAINS:  One Hemovac at tibia.   PROCEDURE:  After induction of general anesthesia patient was placed in  lateral position on the bean bag with C-arm draped over the top in the  rainbow position.  The right lower extremity was prepped from ankle up to  the iliac crest and appropriate stockinettes and Coban and  U sheets and  drapes were applied.  A sterile skin mark was used and then a Vi-Drape was  applied after application of a proximal thigh tourniquet.  The leg was  elevated, tourniquet inflated.  The incision was made following along the  lateral aspect of the tibial crest extending toward Gerdy's tubercle and  stopping at the level of the joint.  The patient had a tibial plateau  fracture which extended up to the level of the joint without joint  displacement or depression.  There was extension to the medial cortex and  posterior cortex as well with comminution.  Alignment even with the  comminution, however, was satisfactory.  The fracture lines were exposed,  reduced, held with self-retaining clamps.  The longest tibial plateau plate  that was there had the three proximal screws for the 6.5 and then eight  distal screws.  The plate was crushed and  bent to fit against the tibial  plateau.  Proximal screws were filled with 50 mm 6.5 cancellous screws.  The  distal most screw was then placed, a 4.5 bicortical, and these screws ranged  from 32-36 mm.   Intermittently, fluoroscopy was used to check position of screws and  bicortical position.  Once all screws were felt, the leg was checked for  stability.  The bone was extremely soft due to her multiple years of MS and  the fact she has been a walker ambulator only with braces; a short leg on  the right and shoe lift on the left for years, and bone quality was similar  to someone 67+ in age with inactivity.  A Hemovac was placed through a  separate stab incision with in-and-out technique.  The fascia was  reapproximated. The anterior  compartment was soft and loose.  2-0 Vicryl was  placed in the subcutaneous tissue, skin staple closure and Marcaine  infiltration.  Next, the fluoroscopy was moved up to the level of the hip.  A stab incision was made under fluoroscopic positioning and a pin was placed  low in the neck on AP view and centered on lateral.  The bone was extremely  soft and as the screw was hand tightened the lateral cortex was not noticed  to give any resistance as the screw penetrated through the cortex.  The  screw had to be backed out so it would not be immediately adjacent to the  joint.  A second screw was placed above this and the third in between the  two and slightly posterior for a total of three screws, 75 mm in length,  which was 7.3 Synthes screws.  Several skin staples were placed in the hip from the two stab incisions, two  above, two below.  Marcaine was infiltrated in these.  Postoperative  dressing and soft dressing was applied to the tibia with reapplication of  the knee immobilizer and transfer to recovery room in stable condition.  Instrument count and needle count were correct.                                               Mark C. Ophelia Charter, M.D.     MCY/MEDQ  D:  10/16/2002  T:  10/17/2002  Job:  [id number]

## 2010-12-12 NOTE — Procedures (Signed)
HISTORY:  The patient is a 75 year old with history of multiple sclerosis  and new onset of confusion. Study is being done to look for the presence of  for seizures and also background activity to determine prognosis.   PROCEDURE:  The tracing is carried out on a 32-channel digital Cadwell  recorder reformatted to 16-channel montages with 1 devoted to EKG. The  patient was awake during the recording. The International 10/20 system of  lead placement was used.   DESCRIPTION OF FINDINGS:  The initial portion of the record is unreadable  due to significant high voltage muscle artifact that obliterates the frontal  and temporal regions. An 8 Hz, 20-25 microvolt activity was seen in the  posterior region. Mixed frequency alpha and beta range components with  associated beta are seen briefly when muscle artifact is not present. There  was no focal slowing. There was no clear-cut interictal epileptiform  activity in the form of spikes or sharp waves. Activating procedures were  not carried out. EKG showed a regular sinus rhythm with ventricular response  of 84 beats per minute.   IMPRESSION:  Technically difficult EEG, essentially normal in the waking  state.      Deanna Artis. Sharene Skeans, M.D.  Electronically Signed     [id number]  D:  03/06/2005 16:19:18  T:  03/06/2005 82:95:[id number]  Job #:  [id number]

## 2010-12-12 NOTE — Discharge Summary (Signed)
[name], [name] NO.:  [id number]   MEDICAL RECORD NO.:  [id number]          PATIENT TYPE:  INP   LOCATION:  6702                         FACILITY:  [facility]   PHYSICIAN:  Lonia Blood, M.D.DATE OF BIRTH:  [birth year]   DATE OF ADMISSION:  10/21/2004  DATE OF DISCHARGE:                                 DISCHARGE SUMMARY   CHIEF COMPLAINT:  Fever and diarrhea.   DISCHARGE DIAGNOSES:  1.  Fever thought secondary to yeast urinary tract infection, now resolved.      1.  Chest x-ray essentially unremarkable.      2.  Blood cultures x1 unremarkable.      3.  Stool for Clostridium difficile negative.      4.  Fever resolved Diflucan therapy day #5 of 7.  2.  Coagulopathy on chronic Coumadin. INR to a high of 4.1.      1.  Pharmacy following Coumadin dosing and blood level. At discharge,          INR 2.2.      2.  Nursing home pharmacy to follow Coumadin dosing and blood level.  3.  Mild hematuria thought secondary to problem #1, resolved.  4.  Normocytic anemia. Discharge hemoglobin 8.9. Recheck basic metabolic      panel, hemoglobin approximately 1 week post discharge.  5.  End-stage multiple sclerosis, resolved.  6.  History dementia, stable.  7.  Dysphagia.      1.  Fiberoptic endoscopy evaluation swallowing study October 14, 2004          noted no aspiration and essentially normal swallow.      2.  Diet to be dysphagia 3 with chopped meats and thin liquids.          Supervision should be full with assistance with feedings.          Medications given whole in puree. Eat and drink only when alert          seated upright, small bites and sips, chin tuck, turn head to left          during swallow which is natural chronic head position. Reflux          precautions with history gastroesophageal reflux disease.      3.  Speech therapy to follow post discharge and in the nursing home to          implement dysphagia diet and aspiration precautions.  8.  History  bilobar right lung pulmonary emboli October 05, 2004. CT scan of      the chest October 05, 2004 noted right middle and upper lobe pulmonary      embolus with bibasilar atelectasis and ectatic thoracic aorta.  9.  Pneumonia versus atelectasis. Chest x-ray last October 25, 2004 notes right      Port-A-Cath, cardiomegaly, basilar atelectasis unchanged. Mild      peribronchial thickening stable. Impression: Stable chest.  10. History gastroesophageal reflux disease.  11. Sepsis per blood culture. Blood culture x2 positive staphylococcus      coagulase negative October 08, 2004. Blood cultures  October 13, 2003 x2      positive Staphylococcus coagulase negative. Port-A-Cath suspicious for      source. Status post completion vancomycin therapy x14 days.  12. Chronic suprapubic catheter. Current urine culture October 21, 2004      greater than 100,000 colony count yeast as above.  13. Diarrhea prior to this hospitalization x2 stool negative Clostridium      difficile.  14. Normocytic anemia.      1.  Baseline hemoglobin in the 9-11 range.      2.  Previous stool check one of three positive early March 2006; no          aggressive workup initiated.      3.  Current hemoglobin stable at discharge 8.9 with requested follow-up          when post discharge.  15. History of hypokalemia, resolved with replacement. Discharge potassium      is 3.9.  16. Chronic constipation/ileus.  17. History osteoporosis.      1.  Left hip fracture previously.      2.  Right tibial fracture previously.  18. History osteomyelitis following right hip surgery.  19. Chronic depression.  20. Right Port-A-Cath placement.  21. Allergies:  SULFA.  22. Code status:  Currently Full Code.   DISCHARGE MEDICATIONS:  1.  MiraLax powder 17 g with 8 ounces of water or juice once daily.  2.  Vitamin C 500 mg twice daily.  3.  Prozac 40 mg once daily.  4.  Baclofen 10 mg four times daily.  5.  Protonix 40 mg twice daily.  6.  Interferon  beta 30 mcg IM once weekly.  7.  Lithostat 250 mg three times daily.  8.  Neurontin 100 mg a.m. and 200 mg at bedtime.  9.  Coumadin 1 mg daily with nursing home pharmacy to follow dosing and      blood level. Will need frequent PT/INR checks post discharge initially      until Coumadin dosing viewed stable.  10. Potassium chloride 20 mEq twice daily.  11. Diflucan 100 mg daily for 2 days post discharge and discontinue.  12. Multivitamin with mineral daily.  13. Ensure dietary supplement three times daily between meals and at      bedtime.  14. Nasal cannula oxygen 1-4 L per minute to keep O2 saturations 90% or      greater.  15. Tylenol 650 mg q.4h. as needed for mild pain or fever.  16. Laxative of choice daily as needed for constipation.   SPECIAL DISCHARGE INSTRUCTIONS:  1.  Disposition:  Discharge back to [facility] facility,      primary care Dr. Murray Hodgkins.  2.  Activity:  Up in chair as tolerated with full assistance. Any further      increase activity as per physical and occupational therapy consult.  3.  Discharge diet:  Dysphagia 3 with chopped meats and thin liquids.      Aspiration precautions including full supervision and assistance with      feedings, given medications whole in applesauce or similar puree      consistency, eat and drink only when alert and seated upright as      possible. Small bites and sips, chin tuck, head turn to the left during      swallow. Reflux precautions considering history of GERD.  4.  Speech therapy consult post discharge to implement dysphagia diet and      aspiration  precautions as above.  5.  Maintain Port-A-Cath per facility protocol.  6.  Maintain suprapubic catheter per facility protocol.  7.  Repeat basic metabolic panel, hemoglobin, hematocrit approximately 1      week post discharge.  8.  Follow bowel movement frequency to avoid constipation. 9.  Currently Full Code status but would consider discussions with  the      patient and family post discharge considering age, frailty, comorbidity      to discuss possible No Code Blue status.   CONSULTS OVER THE COURSE OF HOSPITALIZATION:  None.   PROCEDURES OVER THE COURSE OF HOSPITALIZATION:  None.   DISCHARGE LABORATORY DATA:  PT/INR done October 27, 2004 19.9 and 2.2  respectively. Hemoglobin done October 26, 2004 8.9. Basic metabolic panel done  October 24, 2004 found sodium 136, potassium 3.9, chloride 111, CO2 22, BUN 2,  and creatinine 0.5. Stool x2 were negative for C. difficile. Blood culture  produced no growth after 5 days. Urine culture greater than 100,000 colony  count yeast.   HISTORY OF PRESENT ILLNESS:  This 75 year old female resident of [facility] followed there in primary care by Dr. Murray Hodgkins.  She was hospitalized recently October 05, 2004 through October 14, 2004 due to a  right pulmonary emboli, pneumonia, staph coagulase negative sepsis. Post  discharge to the nursing home, the patient developed fever spikes into the  101-102 range. She had one episode of emesis. She appeared to be confused.  She had some incidents of diarrhea and C. difficile considered a  possibility. The patient was admitted for further evaluation and treatment.   For past medical, family, social history, admission medications, physical  exam, laboratory data, impressions, and plan see admission History and  Physical note dated October 21, 2004.   HOSPITAL COURSE:  #1 - FEVER UNKNOWN ORIGIN. The patient initially presented  as per history of present illness. This suggested a fever unknown origin.  This was worked up from various perspectives. Pneumonia considered a  possibility and a chest x-ray October 21, 2004 noted only left basilar  atelectasis. The patient had been having some diarrhea and stool C.  difficile was obtained. Stools x2 were negative for C. difficile. The  patient had a history of recent staph coagulase negative sepsis  receiving a  course of vancomycin. Blood culture obtained produced no growth over 5 days.  The patient had a urine culture and this did indicate greater than 100,000  colony count yeast. The patient was initiated on Diflucan therapy and fevers  resolved. There was no leukocytosis at time of admission. Today, the patient  is day #5 of 7 antifungal therapy to complete post discharge in nursing  home.   #2 - COAGULOPATHY. The patient is chronically on Coumadin with noted recent  history right lung pulmonary emboli. Pharmacy followed Coumadin dosing and  blood level. With addition antibiotic therapy INR climbed to a high of 4.1  but at discharge is in therapeutic range at 2.2. I am dosing Coumadin at 1  mg daily. Nursing home pharmacy should follow Coumadin dosing and blood  level. Will need frequent PT/INR initially until Coumadin dosing viewed  stable.   #3 - MILD HEMATURIA. The patient does have a suprapubic catheter chronically. Noted some mild hematuria during the initial course of  hospitalization. This thought secondary to the patient's yeast urinary tract  infection and has resolved over the course of hospitalization.   #4 - NORMOCYTIC ANEMIA. The patient has  a history of normocytic anemia. This  was previously worked up with stool occult blood one of three positive. The  patient also had an anemia panel previously with a mixed picture - iron  normal but binding capacity low at 203. Rbc folate and B12 were both  elevated. This likely represents chronic disease. Hemoglobin roughly stable  over the course of hospitalization; at discharge, 8.9. Would recheck  hemoglobin and hematocrit in approximately 1 week.   #5 - END-STAGE MULTIPLE SCLEROSIS. This remained stable over the course of  hospitalization.   #6 - DEMENTIA WITH HISTORY PSYCHOSIS. This remained stable over the course  of hospitalization.   #7 - DYSPHAGIA. This recently studies per speech therapy consult with FEES   procedure noting no significant dysphagia. The patient did have some  aspiration precautions as indicated above. Speech therapy should continue to  follow the patient in the nursing home.   #8 - CODE STATUS. The patient currently Full Code but, again, would  discuss this with the patient and responsible party post discharge to  consider No Code Blue status. This in light of the patient's advanced MS  and other comorbidity, age, frailty.   DISPOSITION:  Discharge back to [facility] facility with  follow-up as above.   ACTIVITY:  Up to chair as tolerated with full assistance. Any further  increase activity as per physical and occupational therapy.   DISCHARGE DIET:  Dysphagia 3 chopped meats and thin liquids. Aspiration  precautions as outlined above. Speech therapy consult at the nursing home.   CONDITION AT TIME OF DISCHARGE:  Stable/improved though frail with multiple  medical problems as above.   Discharge process greater than 30 minutes - 9 a.m. through 10 a.m.      TC/MEDQ  D:  10/27/2004  T:  10/27/2004  Job:  [id number]

## 2010-12-12 NOTE — H&P (Signed)
NAMEBARNEY, [name] NO.:  [id number]   MEDICAL RECORD NO.:  [id number]          PATIENT TYPE:  INP   LOCATION:  1824                         FACILITY:  [facility]   PHYSICIAN:  Lonia Blood, M.D.DATE OF BIRTH:  1935-08-24   DATE OF ADMISSION:  10/05/2004  DATE OF DISCHARGE:                                HISTORY & PHYSICAL   CHIEF COMPLAINT:  Shortness of breath.   HISTORY OF PRESENT ILLNESS:  [name] is a very pleasant 75 year old  female who suffers with severe end-stage multiple sclerosis.  She resides at  a local nursing home.  The patient had been in her usual state of health  until the night of admission, when she began to complain of shortness of  breath.  Nursing home staff reported coarse upper airway sounds which had  occurred abruptly.  The patient has a history of aspiration and because of  this, she was transferred to [facility] for evaluation and a  concern for right lobe respiratory decline as per her history.  In the  emergency room, the patient reported intermittent shortness of breath  throughout the day.  She denied chest pain.  She denied fevers or chills.  She was no evidence of nausea and vomiting.  D-dimer was obtained which was  noted to be markedly elevated.  As a result, CT scan of the chest was  obtained and this revealed bilobar (right upper and right middle lobe)  pulmonary emboli.  I was consulted to evaluate the patient for admission.   REVIEW OF SYSTEMS:  Other than elements noted above, full review of systems  is negative per the patient.  She does have a history of chronic  constipation as noted below.   PAST MEDICAL HISTORY:  1.  History of aspiration pneumonia/pneumonitis.  2.  Chronic suprapubic catheter.  3.  Multiple sclerosis - severe.      1.  Effective quadriparesis.      2.  Chronic ileus.      3.  Neurogenic bladder.      4.  Neurogenic edema.      5.  Left torticollis.  4.  Osteoporosis.    1.  History of left hip fracture.      2.  History of right tibial fracture.  5.  Paranoia.  6.  Gastroesophageal reflux disease.  7.  Questionable osteomyelitis of the right hip by history.  8.  Chronic depression.  9.  Status post Port-A-Cath placement right side.   OUTPATIENT MEDICATIONS:  1.  Neurontin 100 mg a.m., 200 mg q.h.s.  2.  Albunex 30 mcg IM q.week on Fridays.  3.  MiraLax 17 grams daily.  4.  Prozac 40 mg daily.  5.  Vitamin C 500 mg b.i.d.  6.  Prilosec 20 mg b.i.d.  7.  Lithostat 25 mg t.i.d.  8.  Baclofen 10 mg q.i.d.   ALLERGIES:  SULFA.   FAMILY HISTORY:  Noncontributory to this admission.   DIET:  The patient usually is on a ground-meat diet with thin liquids and  medicines crushed in  applesauce.   SOCIAL HISTORY:  The patient grew up in [place].  She was residing at  [facility].  She did not smoke.  She did not drink.   LABORATORY DATA:  Hemoglobin 11.6 with MCV 94, white count is normal,  platelets at 217.  Electrolytes are balanced with the exception of potassium  which is low at 3.3.  BUN 11, creatinine 1.7.  INR is normal.  The pH is  7.43, pCO2 38, and pO2 71.   CT scan of the chest reveals right upper lobe and right middle lobe  pulmonary emboli.   PHYSICAL EXAMINATION:  VITAL SIGNS:  Temperature 99.0, blood pressure  114/71, heart rate 95, respiratory rate 16, O2 saturations 96% on 2 liters  per minute.  GENERAL:  [name] female lying on hospital stretcher in no acute  respiratory distress on gross examination.  LUNGS:  Fine crackles diffusely, poor inspiratory effort, no appreciable  wheeze.  Right basilar crackles, faint.  HEART:  Regular rate and rhythm without murmurs, rubs, or gallops.  Normal  S1 and S2.  ABDOMEN:  Distended, mildly tender to deep palpation.  Bowel sounds  hypoactive, but present.  No mass, no rebound.  EXTREMITIES:  2+ bilateral lower extremity edema to mid-thigh.  No erythema,  no cyanosis,  no appreciable cutaneous ulcerations of the lower extremities.  NEUROLOGY:  Alert and oriented x4.  4+/5 strength left upper extremity and  4/5 strength in the right upper extremity, though fine motor movement of the  right hand is lost.  The patient is unable to move her lower extremities at  all.   IMPRESSION:  [name] is being admitted for bilobar pulmonary emboli.  Her obvious risk factor for this is her end-stage multiple sclerosis and  bedridden status with no ability to move her legs whatsoever.  She will be  treated with IV heparin, transitioned over to p.o. Coumadin.  She will  likely need to remain on Coumadin the rest of her life given her high risk  for recurrent embolization.  I do not feel a Doppler evaluation will add to  our therapy in any way and therefore it will not be carried out.  Otherwise,  the patient's chronic home medications will be continued as per the [facility] that  presented with the patient from the nursing facility.  Hypokalemia will be  treated with oral replacement therapy.  Magnesium will be obtained to assure  that there is no evidence of hypomagnesemia complicating the picture.  At  such time that the patient has a therapeutic INR overlapping with 48 hours  of full-dose heparin therapy, she will be cleared for transfer back to the  nursing facility.      JTM/MEDQ  D:  10/06/2004  T:  10/06/2004  Job:  [id number]

## 2010-12-12 NOTE — Discharge Summary (Signed)
NAME:  KIAJA, SHORTY                           ACCOUNT NO.:  [id number]   MEDICAL RECORD NO.:  [id number]                   PATIENT TYPE:  INP   LOCATION:  0479                                 FACILITY:  [facility]   PHYSICIAN:  Mark C. Ophelia Charter, M.D.                 DATE OF BIRTH:  1935-08-09   DATE OF ADMISSION:  10/15/2002  DATE OF DISCHARGE:  10/20/2002                                 DISCHARGE SUMMARY   DISCHARGE DIAGNOSES:  1. Multiple sclerosis.  2. Fall with right tibial plateau and shaft fracture, closed.  3. Right infected femoral neck fracture.   MEDICATIONS ON ADMISSION:  1. Clonazepam 0.5 mg daily.  2. Hydrochlorothiazide 50 mg p.o. daily.  3. Ditropan XL 10 mg daily.  4. Baclofen 10 mg q.i.d.  5. Celebrex 200 mg b.i.d.  6. Fluoxetine HCL 40 mg one p.o. daily.   HISTORY OF PRESENT ILLNESS:  This is a 75 year old female with a 30-year  history of MS who usually ambulates with a walker and uses a double upright  short-leg brace on the right and a 3/4 to 1 inch build up shoe lift on the  left who fell in the bathtub suffering a closed femoral neck impacted  fracture and a proximal tibia fracture which extended into the tibial  plateau principally involving proximal third of the shaft with comminution.   HOSPITAL COURSE:  She was admitted and taken to the operating room on October 16, 2002, and underwent percutaneous pinning of a femoral neck fracture and  ORIF of the right tibial plateau with a long T-shaped Synthes plate.  Postop, she had good pain control and was neurovascularly intact.  Hemoglobin was 9.0.  On postop day #2, hemoglobin was 8.2 and she was  transfused.  She had problems with constipation and was given some magnesium  citrate.  On March 24, when hemoglobin turned out to be 8, that was the date  that she did receive the one unit of packed cells.  She had a Port-A-Cath  placed many years ago which was not used initially on hospitalization, but  was used  postoperatively.  She was tolerating her pain well and was  transferred bed to chair.  Due to extreme weakness, she is not a candidate  for ambulation with extremely osteopenic bone.  Once she is healed, she will  require a bone density test and probably will need to be treated for her  osteoporosis.  Dressing was changed the day of transfer.  Arrangements were  made by care management for transfer to [facility].  She will have office followup with Dr. Ophelia Charter one week from the date of  transfer for probable staple removal from her hip and also from her tibia  and x-rays in the office.  Condition on transfer is stable.  On the night of  March 25, her suprapubic catheter  tube got tangled up, was pulled out and  was reinserted without incident.  This is a chronic indwelling suprapubic  catheter that she has had for years.   DISCHARGE MEDICATIONS:  1. Clonazepam 0.5 mg daily.  2. Hydrochlorothiazide 50 mg p.o. daily.  3. Ditropan XL 10 mg daily.  4. Baclofen 10 mg q.i.d.  5.     Celebrex 200 mg b.i.d.  6. Fluoxetine HCL 40 mg one p.o. daily.  7. Vicodin p.r.n. pain.                                               Mark C. Ophelia Charter, M.D.    MCY/MEDQ  D:  10/20/2002  T:  10/20/2002  Job:  [id number]

## 2010-12-12 NOTE — H&P (Signed)
NAME:  [name], [name]                           ACCOUNT NO.:  [id number]   MEDICAL RECORD NO.:  [id number]                   PATIENT TYPE:  INP   LOCATION:  0479                                 FACILITY:  [facility]   PHYSICIAN:  Mark C. Ophelia Charter, M.D.                 DATE OF BIRTH:  1935/12/23   DATE OF ADMISSION:  10/15/2002  DATE OF DISCHARGE:                                HISTORY & PHYSICAL   ADMISSION DIAGNOSIS:  Multiple right lower extremity fractures from a fall.   HISTORY OF PRESENT ILLNESS:  This 75 year old female with long history of 30  years of MS fell in the bathtub, suffering a right closed femoral neck  impacted fracture and a right tibial plateau fracture involving the lateral  tibial plateau as well as the medial shaft with extension of the proximal  third.  She has had multiple sclerosis greater than 20 years.  She has used  a walker for ambulation x4 years.  She wears a double upright short-leg  brace on the right, a three quarter to one inch shoe lift on the left.   ALLERGIES:  SULFA.   CURRENT MEDICATIONS:  1. Clonazepam 0.5 mg q.h.s.  2. Hydrochlorothiazide 50 mg one a day.  3. Ditropan XL 10 mg daily.  4. Baclofen 10 mg q.i.d.  5. Celebrex 200 mg b.i.d.  6. Fluoxetine HCl 40 mg one p.o. daily.   SOCIAL HISTORY:  The patient lives alone.  She is here with her friend.  She  grew up in [place] and does not have any immediate family members in  [place], [place].  No history of bleeding or anesthetic  complications.   HABITS:  She does not smoke.  She does not drink.   REVIEW OF SYSTEMS:  Negative for previous MI, congestive heart failure, or  seizure disorder.   PHYSICAL EXAMINATION:  GENERAL APPEARANCE:  The patient is alert,  cooperative and responsive.  VITAL SIGNS:  Blood pressure 102/70, pulse 70, respiratory rate 20,  temperature 98.  HEENT:  No evidence of head trauma.  PERRLA.  EOMI.  CARDIOVASCULAR:  Regular rate and rhythm.  BREASTS:   Normal.  ABDOMEN:  Soft and nontender.  She has a suprapubic urinary catheter and  uses a left leg bag.  EXTREMITIES:  She wears TED hose.  Pulses are palpable.  She has bilateral  pitting edema.  Both hip and tibial fractures are closed injuries and  compartment is soft in the tibia.   ASSESSMENT:  A patient with multiple sclerosis with fall and right femoral  neck fracture impacted and comminuted tibial plateau and shaft fracture with  proximal fibular fracture at the head with intact peroneal nerve.    PLAN:  Admit and ORIF tibia, __________ screw fixation hip.  Planned  procedure discussed with the patient and the risks of surgery discussed.  Extensive rehab expected with period of  at least six weeks nonweightbearing  so that she can heal up fractures enough that she can resume her limited  household ambulation.  All questions answered.  She understands and requests  to proceed.                                               Mark C. Ophelia Charter, M.D.    MCY/MEDQ  D:  10/16/2002  T:  10/17/2002  Job:  [id number]

## 2010-12-12 NOTE — Discharge Summary (Signed)
NAMEITZAYANA, PARDY NO.:  [id number]   MEDICAL RECORD NO.:  [id number]          PATIENT TYPE:  INP   LOCATION:  5504                         FACILITY:  [facility]   PHYSICIAN:  Zenaida Deed. Mayford Knife, M.D.DATE OF BIRTH:  Feb 21, 1936   DATE OF ADMISSION:  06/13/2004  DATE OF DISCHARGE:                                 DISCHARGE SUMMARY   CONSULTATIONS:  None.   PROCEDURES:  None.   DISCHARGE DIAGNOSES:  1.  Aspiration events.  2.  Asymptomatic pyuria and hematuria with chronic suprapubic catheter.  3.  Multiple sclerosis that bound (quadriparesis, constipation, chronic      ileus, neurogenic bladder, left torticollis, neurogenic edema).  4.  Osteoporosis status post right hip fracture open reduction and internal      fixation and right tibial fracture.  5.  History of paranoia.  6.  History of gastroesophageal reflux disease.  7.  History of questionable right hip fracture site osteomyelitis.  8.  History of depression.  9.  History of bilateral cerumen infection.  10. History of Port-A-Cath placement.   DISCHARGE MEDICATIONS:  1.  Rocephin 1 g IV q.24h. x 4 additional doses.  2.  Flagyl 500 mg p.o. t.i.d. x 4 additional days.  3.  DuoNeb nebulizer treatment four times a day x 4 additional days.  4.  Neurontin 100 mg p.o. q.a.m. and 200 mg p.o. q.h.s.  5.  Centrum singles vitamin C 500 mg p.o. b.i.d.  6.  Prilosec over-the-counter 20 mg p.o. b.i.d.  7.  Lithostat 250 mg p.o. t.i.d.  8.  Theragran 1 p.o. daily.  9.  Baclofen 10 mg p.o. four times a day  10. Peridex mouthwash rinse and spit daily.  11. Avonex injection 30 mcg IM each week.  12. MiraLax powder 1 cap daily.  13. Prozac 40 mg p.o. daily.  14. The patient will continue all of her prior p.r.n. medications.   DISCHARGE INSTRUCTIONS:  1.  The patient should continue her ground meat diet administered under full      supervision.  2.  Medications should be crushed in apple sauce.  3.  Aspiration  precautions should be followed.  4.  Oxygen per nasal cannula should be administered at 2 liters consistently      over the next 4 days; room air saturation may then be checked and oxygen      discontinued if saturation is greater than 92% on room air and patient      is asymptomatic.  5.  IV fluids D5 normal saline should continue at 75 mL per hour for the      next 48 hours after which she may have a saline lock.  6.  BMET, CBC, and repeat urinalysis and cultures should be obtained in 5      days.  7.  All prior orders regarding personal and medical care should be resumed      at [facility].   SUMMARY:  Mrs. Blase is a resident at [facility], who is  known to me, suffering from complications of advanced  multiple sclerosis.  Last night she developed a choking episode accompanied by what sounds like  posttussive emesis leading to respiratory distress and hypoxia for which EMS  was summoned.  She was transported to the emergency room where oxygenation  improved to 98% on nonrebreather.  Her vital signs at that time were stable  with pulse of 98, respirations 22, and blood pressure 138/88.  Chest x-ray  indicated possible suggestion of infiltrates in the right lung, perhaps  consistent with an aspiration episode.  Her ABG on arrival was 7.49, PCO2  32, although this does not appear to have been an arterial sample.  Her  hemoglobin was 14.3.  Sodium 139, potassium 4.3, BUN 15, creatinine 2.0.  Urinalysis indicates too-numerous-to-count white blood cells and 21 to 50  red blood cells with many bacteria despite absence of symptoms and normal  temperature.   HOSPITAL COURSE:  Mrs. Brumley was admitted to the floor for observation  where she continued to receive oxygen delivered at 2 liters per nasal  cannula and periodic bronchodilator therapy.  Antibiotic coverage for aspiration was provided with Rocephin and Flagyl IV.  The ground meat diet with thin liquids  was resumed.   Since arrival, she has been asymptomatic.  On my examination this morning, I  find her to be breathing comfortably, carrying on normal speech pattern,  using no accessory muscles, and with no adventitious sounds appreciated on  lung exam.  Her heart is normal sinus rhythm with occasional ectopy by  auscultation.   Mrs. Hussar explains that over the previous few days she has had more  episodes of choking which have no always been connected with eating.  She  denies any difficulty with her swallowing and states that her food does seem  to pass easily.  She is not sure what is underlying these episodes.  She is  stable and I feel does not necessarily require further hospital monitoring,  although I will be able to see her in the facility the day after tomorrow in  followup.   She is noted to have elevated creatinine (I am unsure of her baseline as I  do not have her [facility] chart readily available), but I  believe that she warrants addition IV hydration after which we will obtain a  BMET.  At this time, I do not feel urinalysis necessarily warrants  treatment, but we will repeat the study along with a culture in five days  and continue to monitor her symptoms and her temperature.  She is  predisposed to irritation of the bladder due to her suprapubic catheter as  well as colonization, and at this point I do not believe her findings  suggest infection.   DISPOSITION:  Mrs. Nicholls will be discharged back to [facility] facility in stable and improved condition.  I will follow her in 48  hours' time.       JAW/MEDQ  D:  06/14/2004  T:  06/14/2004  Job:  [id number]

## 2011-04-21 LAB — DIFFERENTIAL
Basophils Absolute: 0
Basophils Absolute: 0
Basophils Absolute: 0
Basophils Relative: 0
Eosinophils Absolute: 0
Eosinophils Absolute: 0.3
Lymphocytes Relative: 11 — ABNORMAL LOW
Lymphocytes Relative: 13
Lymphs Abs: 1.3
Lymphs Abs: 1.8
Monocytes Absolute: 0.4
Monocytes Absolute: 0.6
Monocytes Relative: 2 — ABNORMAL LOW
Monocytes Relative: 4
Monocytes Relative: 4
Neutro Abs: 17.3 — ABNORMAL HIGH
Neutro Abs: 8.8 — ABNORMAL HIGH
Neutro Abs: 9.8 — ABNORMAL HIGH
Neutrophils Relative %: 79 — ABNORMAL HIGH
Neutrophils Relative %: 81 — ABNORMAL HIGH
Neutrophils Relative %: 88 — ABNORMAL HIGH

## 2011-04-21 LAB — CBC
HCT: 31.4 — ABNORMAL LOW
HCT: 34.4 — ABNORMAL LOW
Hemoglobin: 10.5 — ABNORMAL LOW
Hemoglobin: 11.6 — ABNORMAL LOW
MCHC: 33.4
MCV: 85.6
MCV: 85.7
MCV: 86.1
MCV: 86.6
Platelets: 204
Platelets: 212
Platelets: 352
RBC: 3.61 — ABNORMAL LOW
RBC: 3.87
RBC: 3.92
RDW: 14.6
RDW: 14.9
RDW: 15
WBC: 11.6 — ABNORMAL HIGH
WBC: 13.2 — ABNORMAL HIGH
WBC: 14 — ABNORMAL HIGH

## 2011-04-21 LAB — BASIC METABOLIC PANEL
CO2: 26
Chloride: 105
GFR calc Af Amer: >60
Potassium: 3.7

## 2011-04-21 LAB — URINALYSIS, ROUTINE W REFLEX MICROSCOPIC
Glucose, UA: NEGATIVE
Ketones, ur: 15 — AB
Nitrite: NEGATIVE
Protein, ur: 100 — AB
Specific Gravity, Urine: 1.01
Urobilinogen, UA: 0.2
Urobilinogen, UA: 1

## 2011-04-21 LAB — CULTURE, BLOOD (ROUTINE X 2): Culture: NO GROWTH

## 2011-04-21 LAB — POCT I-STAT, CHEM 8
BUN: 12
Calcium, Ion: 1.15
Hemoglobin: 12.6
TCO2: 30

## 2011-04-21 LAB — PROTIME-INR
INR: 2.3 — ABNORMAL HIGH
INR: 3 — ABNORMAL HIGH
INR: 3.5 — ABNORMAL HIGH
INR: 3.5 — ABNORMAL HIGH
Prothrombin Time: 25.7 — ABNORMAL HIGH
Prothrombin Time: 32.4 — ABNORMAL HIGH
Prothrombin Time: 36.3 — ABNORMAL HIGH
Prothrombin Time: 37.9 — ABNORMAL HIGH

## 2011-04-21 LAB — URINE MICROSCOPIC-ADD ON

## 2011-04-21 LAB — URINE CULTURE: Colony Count: >=100000

## 2011-04-22 LAB — BASIC METABOLIC PANEL
CO2: 32
Calcium: 9.2
Creatinine, Ser: 0.56
Glucose, Bld: 92

## 2011-04-22 LAB — DIFFERENTIAL
Basophils Absolute: 0
Basophils Relative: 1
Eosinophils Relative: 7 — ABNORMAL HIGH
Lymphocytes Relative: 30
Lymphs Abs: 2.2
Monocytes Absolute: 0.4
Monocytes Absolute: 0.4
Monocytes Relative: 5
Neutro Abs: 4.2

## 2011-04-22 LAB — COMPREHENSIVE METABOLIC PANEL
AST: 41 — ABNORMAL HIGH
Albumin: 3.1 — ABNORMAL LOW
Calcium: 9
Chloride: 101
Creatinine, Ser: 0.53
GFR calc Af Amer: >60
Total Protein: 7.4

## 2011-04-22 LAB — CBC
Hemoglobin: 11.6 — ABNORMAL LOW
MCHC: 33.2
MCHC: 33
MCV: 83.4
Platelets: 272
RDW: 15
RDW: 14.8
WBC: 7.5

## 2011-04-27 LAB — BASIC METABOLIC PANEL
CO2: 26
Calcium: 8.6
Creatinine, Ser: 0.7
GFR calc non Af Amer: >60
Potassium: 3.3 — ABNORMAL LOW
Sodium: 137

## 2011-04-27 LAB — URINALYSIS, ROUTINE W REFLEX MICROSCOPIC
Bilirubin Urine: NEGATIVE
Ketones, ur: NEGATIVE
Nitrite: NEGATIVE
Protein, ur: 30 — AB
pH: 6

## 2011-04-27 LAB — URINE MICROSCOPIC-ADD ON

## 2011-04-27 LAB — CBC
MCHC: 32.7
RBC: 4.3
WBC: 18.6 — ABNORMAL HIGH

## 2011-04-27 LAB — DIFFERENTIAL
Basophils Absolute: 0
Basophils Relative: 0
Monocytes Absolute: 0.3
Neutro Abs: 16.6 — ABNORMAL HIGH
Neutrophils Relative %: 89 — ABNORMAL HIGH

## 2011-04-27 LAB — CULTURE, BLOOD (ROUTINE X 2): Culture: NO GROWTH

## 2011-04-27 LAB — URINE CULTURE

## 2011-04-27 LAB — PROTIME-INR: Prothrombin Time: 27.7 — ABNORMAL HIGH

## 2011-04-27 LAB — APTT: aPTT: 46 — ABNORMAL HIGH

## 2011-04-28 LAB — CBC
Hemoglobin: 10.9 — ABNORMAL LOW
Platelets: 196
RBC: 3.86 — ABNORMAL LOW
RDW: 15.5
WBC: 18.4 — ABNORMAL HIGH

## 2011-04-28 LAB — BASIC METABOLIC PANEL
BUN: 12
Calcium: 8.6
Creatinine, Ser: 0.58
GFR calc Af Amer: >60
GFR calc non Af Amer: >60
GFR calc non Af Amer: >60
Sodium: 138

## 2011-04-28 LAB — PROTIME-INR
INR: 2.6 — ABNORMAL HIGH
Prothrombin Time: 25.9 — ABNORMAL HIGH

## 2012-10-28 DIAGNOSIS — E1139 Type 2 diabetes mellitus with other diabetic ophthalmic complication: Secondary | ICD-10-CM

## 2012-10-28 DIAGNOSIS — N821 Other female urinary-genital tract fistulae: Secondary | ICD-10-CM

## 2012-10-28 DIAGNOSIS — K352 Acute appendicitis with generalized peritonitis, without abscess: Secondary | ICD-10-CM

## 2012-10-28 DIAGNOSIS — G35 Multiple sclerosis: Secondary | ICD-10-CM

## 2012-11-03 ENCOUNTER — Other Ambulatory Visit: Payer: Self-pay | Admitting: *Deleted

## 2012-11-03 MED ORDER — ZOLPIDEM TARTRATE 5 MG PO TABS: ORAL_TABLET | ORAL | Status: DC

## 2013-02-22 ENCOUNTER — Encounter: Payer: Self-pay | Admitting: Internal Medicine

## 2013-02-24 DIAGNOSIS — G35 Multiple sclerosis: Secondary | ICD-10-CM

## 2013-02-24 DIAGNOSIS — Z86711 Personal history of pulmonary embolism: Secondary | ICD-10-CM

## 2013-02-24 DIAGNOSIS — I1310 Hypertensive heart and chronic kidney disease without heart failure, with stage 1 through stage 4 chronic kidney disease, or unspecified chronic kidney disease: Secondary | ICD-10-CM

## 2013-03-03 DIAGNOSIS — E118 Type 2 diabetes mellitus with unspecified complications: Secondary | ICD-10-CM | POA: Insufficient documentation

## 2013-03-03 DIAGNOSIS — I1310 Hypertensive heart and chronic kidney disease without heart failure, with stage 1 through stage 4 chronic kidney disease, or unspecified chronic kidney disease: Secondary | ICD-10-CM | POA: Insufficient documentation

## 2013-03-03 DIAGNOSIS — N319 Neuromuscular dysfunction of bladder, unspecified: Secondary | ICD-10-CM | POA: Insufficient documentation

## 2013-03-03 DIAGNOSIS — N219 Calculus of lower urinary tract, unspecified: Secondary | ICD-10-CM | POA: Insufficient documentation

## 2013-03-03 DIAGNOSIS — Z86711 Personal history of pulmonary embolism: Secondary | ICD-10-CM | POA: Insufficient documentation

## 2013-03-03 DIAGNOSIS — N821 Other female urinary-genital tract fistulae: Secondary | ICD-10-CM | POA: Insufficient documentation

## 2013-03-03 DIAGNOSIS — M3500 Sicca syndrome, unspecified: Secondary | ICD-10-CM | POA: Insufficient documentation

## 2013-03-03 DIAGNOSIS — G8220 Paraplegia, unspecified: Secondary | ICD-10-CM | POA: Insufficient documentation

## 2013-03-03 DIAGNOSIS — L97809 Non-pressure chronic ulcer of other part of unspecified lower leg with unspecified severity: Secondary | ICD-10-CM

## 2013-03-03 DIAGNOSIS — K3533 Acute appendicitis with perforation and localized peritonitis, with abscess: Secondary | ICD-10-CM | POA: Insufficient documentation

## 2013-03-03 DIAGNOSIS — R1312 Dysphagia, oropharyngeal phase: Secondary | ICD-10-CM | POA: Insufficient documentation

## 2013-03-03 DIAGNOSIS — R739 Hyperglycemia, unspecified: Secondary | ICD-10-CM | POA: Insufficient documentation

## 2013-03-03 DIAGNOSIS — F22 Delusional disorders: Secondary | ICD-10-CM | POA: Insufficient documentation

## 2013-03-03 NOTE — Progress Notes (Signed)
  Subjective:    Patient ID: [name], female    DOB: 24-Apr-1936, 77 y.o.   MRN: [id number]  HPI Patient is on [employer] [facility].  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 She was short of breath frequently.  Multiple sclerosis: This is the main reason for her continued SNF study. There've been no flares or abrupt changes in neurologic status.  MEDICATION REVIEWED: Medication order sheets at nursing home were reviewed  Review of Systems  Constitutional:       Frail. Fragile. Elderly. Marland Kitchen  HENT: Positive for hearing loss and neck stiffness. Negative for ear pain.   Eyes: Negative.   Respiratory: Positive for shortness of breath.   Cardiovascular: Negative for chest pain, palpitations and leg swelling.  Gastrointestinal: Negative.   Genitourinary:        Chronic urinary incontinence. History of vesicovaginal fistula.  Musculoskeletal:       Quadriplegia. Chronic right knee pain.  Skin:       The right calf has an area of thinning of the skin posteriorly. Has had ulcerations in the past, but none evident today.  Neurological:       History multiple sclerosis. Quadriplegic.  Hematological: Negative.   Psychiatric/Behavioral: Negative.        Objective:BP 138/72  Pulse 80  Resp 22  Wt 186 lb (84.369 kg)    Physical Exam  Constitutional: She is oriented to person, place, and time.  Chronically ill, frail, fragile, elderly female.  HENT:  Head: Atraumatic.  Mild hearing loss bilaterally  Eyes: Conjunctivae and EOM are normal. Pupils are equal, round, and reactive to light.  Neck: Normal range of motion. No JVD present. No tracheal deviation present. No thyromegaly present.  Cardiovascular: Normal rate, regular rhythm, normal heart sounds and  intact distal pulses.  Exam reveals no gallop and no friction rub.   No murmur heard. Pulmonary/Chest: Effort normal. No respiratory distress. She has no wheezes. She has rales.  Abdominal: Soft. Bowel sounds are normal. She exhibits no distension and no mass.  Musculoskeletal:  Reduced range of motion at all extremities. Patient is unable to lift the legs from the bed. She has weak movements of both arms.  Lymphadenopathy:    She has no cervical adenopathy.  Neurological: She is alert and oriented to person, place, and time. No cranial nerve deficit.  Skin: No rash noted. No erythema. No pallor.  Psychiatric: She has a normal mood and affect. Her behavior is normal. Judgment and thought content normal.          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 Appears to be compensated at this time.  Chronic kidney disease, stage III (moderate): Continue to monitor lab work. Appears to be compensated at this time.  Personal history of pulmonary embolism: Remote history. No recent chest pain or shortness of breath.  Multiple sclerosis: Stable and without recent neurologic changes.   This encounter was created in error - please disregard.

## 2013-03-10 DIAGNOSIS — L97809 Non-pressure chronic ulcer of other part of unspecified lower leg with unspecified severity: Secondary | ICD-10-CM

## 2013-03-13 ENCOUNTER — Other Ambulatory Visit: Payer: Self-pay | Admitting: Geriatric Medicine

## 2013-03-13 MED ORDER — TRAMADOL HCL ER 100 MG PO TB24: 100.0000 mg | ORAL_TABLET | Freq: Every day | ORAL | Status: DC

## 2013-03-13 MED ORDER — TRAMADOL HCL ER 100 MG PO TB24: ORAL_TABLET | ORAL | Status: DC

## 2013-03-24 ENCOUNTER — Other Ambulatory Visit: Payer: Self-pay | Admitting: *Deleted

## 2013-03-24 MED ORDER — TRAMADOL HCL 50 MG PO TABS: ORAL_TABLET | ORAL | Status: DC

## 2013-06-02 ENCOUNTER — Other Ambulatory Visit: Payer: Self-pay | Admitting: *Deleted

## 2013-06-02 MED ORDER — ZOLPIDEM TARTRATE 5 MG PO TABS: ORAL_TABLET | ORAL | Status: DC

## 2013-08-02 ENCOUNTER — Non-Acute Institutional Stay (SKILLED_NURSING_FACILITY): Payer: PRIVATE HEALTH INSURANCE | Admitting: Internal Medicine

## 2013-08-02 ENCOUNTER — Encounter: Payer: Self-pay | Admitting: Internal Medicine

## 2013-08-02 DIAGNOSIS — F22 Delusional disorders: Secondary | ICD-10-CM

## 2013-08-02 DIAGNOSIS — N319 Neuromuscular dysfunction of bladder, unspecified: Secondary | ICD-10-CM

## 2013-08-02 DIAGNOSIS — I69359 Hemiplegia and hemiparesis following cerebral infarction affecting unspecified side: Secondary | ICD-10-CM | POA: Insufficient documentation

## 2013-08-02 DIAGNOSIS — E1149 Type 2 diabetes mellitus with other diabetic neurological complication: Secondary | ICD-10-CM

## 2013-08-02 DIAGNOSIS — L8992 Pressure ulcer of unspecified site, stage 2: Secondary | ICD-10-CM

## 2013-08-02 DIAGNOSIS — Z86711 Personal history of pulmonary embolism: Secondary | ICD-10-CM | POA: Insufficient documentation

## 2013-08-02 DIAGNOSIS — F339 Major depressive disorder, recurrent, unspecified: Secondary | ICD-10-CM

## 2013-08-02 DIAGNOSIS — Z9359 Other cystostomy status: Secondary | ICD-10-CM | POA: Insufficient documentation

## 2013-08-02 DIAGNOSIS — D649 Anemia, unspecified: Secondary | ICD-10-CM

## 2013-08-02 DIAGNOSIS — N183 Chronic kidney disease, stage 3 unspecified: Secondary | ICD-10-CM

## 2013-08-02 DIAGNOSIS — D638 Anemia in other chronic diseases classified elsewhere: Secondary | ICD-10-CM | POA: Insufficient documentation

## 2013-08-02 DIAGNOSIS — L8990 Pressure ulcer of unspecified site, unspecified stage: Secondary | ICD-10-CM

## 2013-08-02 DIAGNOSIS — G8220 Paraplegia, unspecified: Secondary | ICD-10-CM

## 2013-08-02 DIAGNOSIS — I69959 Hemiplegia and hemiparesis following unspecified cerebrovascular disease affecting unspecified side: Secondary | ICD-10-CM

## 2013-08-02 DIAGNOSIS — G35 Multiple sclerosis: Secondary | ICD-10-CM

## 2013-08-02 DIAGNOSIS — Z9350 Unspecified cystostomy status: Secondary | ICD-10-CM

## 2013-08-02 NOTE — Assessment & Plan Note (Signed)
12/22 H/H 11.5/35.4, gradual slt dec from prior

## 2013-08-02 NOTE — Assessment & Plan Note (Addendum)
Chronic ,stable;seroquel 12.5 mg to continue

## 2013-08-02 NOTE — Assessment & Plan Note (Signed)
Chronic warfarin-will continue to monitor

## 2013-08-02 NOTE — Assessment & Plan Note (Signed)
End stage- no therapy;poor prognosis; neurogenic bladder,dysphagia and visual impairment

## 2013-08-02 NOTE — Assessment & Plan Note (Signed)
With chronic suprapubic catheter-has frequent UTIs, not at this time

## 2013-08-02 NOTE — Assessment & Plan Note (Signed)
Will continue effexor

## 2013-08-02 NOTE — Assessment & Plan Note (Signed)
Lisinopril 2.5 mg daily; 12/22 BUN/Cr 13/0.53   GFR >89

## 2013-08-02 NOTE — Assessment & Plan Note (Addendum)
HbA1c 5.6 07/17/2013 on no meds

## 2013-08-02 NOTE — Assessment & Plan Note (Signed)
RLE active again-wound care

## 2013-08-02 NOTE — Progress Notes (Signed)
MRN: [id number] Name: [name]  Sex: female Age: 78 y.o. DOB: 17-Apr-1936  [facility] Facility/Room: [address]A Level Of Care: SNF Provider: Merrilee SeashoreALEXANDER, ANNE D Emergency Contacts: Extended Emergency Contact Information Primary Emergency Contact: CORBITT,GLADYS Address: [address]          [place],  [id number] Home Phone: [phone] Relation: None  Code Status: DNR  Allergies: Strawberry and Sulfonamide derivatives  Chief Complaint  Patient presents with  . Medical Managment of Chronic Issues    HPI: Patient is 78 y.o. female who has paraplegia from MS and R side weakness from CVA being seen for chronic medical issues.  Past Medical History  Diagnosis Date  . Type II or unspecified type diabetes mellitus with ophthalmic manifestations, not stated as uncontrolled(250.50)   . Delusional disorder(297.1)   . Multiple sclerosis   . Paraplegia   . 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 Urinary-genital tract fistula, female   . Other functional disorders of intestine   . Chronic kidney disease, stage III (moderate)   . Acute appendicitis with peritoneal abscess   . Calculus of lower urinary tract, unspecified   . Sicca syndrome   . Dysphagia, unspecified(787.20)   . Personal history of pulmonary embolism   . Neurogenic bladder, NOS   . Type II or unspecified type diabetes mellitus with neurological manifestations, not stated as uncontrolled(250.60)   . Multiple sclerosis   . Hip fracture   . Tibial fracture     History reviewed. No pertinent past surgical history.    Medication List       This list is accurate as of: 08/02/13  4:03 PM.  Always use your most recent med list.               baclofen 10 MG tablet  Commonly known as:  LIORESAL  Take 10 mg by mouth 3 (three) times daily. An 1 prn between 12a-3a     calcium carbonate 500 MG chewable  tablet  Commonly known as:  TUMS - dosed in mg elemental calcium  Chew 1 tablet by mouth daily.     ergocalciferol 50000 UNITS capsule  Commonly known as:  VITAMIN D2  Take 50,000 Units by mouth every 30 (thirty) days.     feeding supplement (PRO-STAT SUGAR FREE 64) Liqd  Take 30 mLs by mouth daily.     fluticasone 50 MCG/ACT nasal spray  Commonly known as:  FLONASE  Place 1 spray into both nostrils 2 (two) times daily.     ipratropium-albuterol 0.5-2.5 (3) MG/3ML Soln  Commonly known as:  DUONEB  Take 3 mLs by nebulization every 6 (six) hours as needed.     lactulose 10 GM/15ML solution  Commonly known as:  CHRONULAC  Take 20 g by mouth every 12 (twelve) hours.     lidocaine 5 %  Commonly known as:  LIDODERM  Place 1 patch onto the skin daily. Remove & Discard patch within 12 hours or as directed by MD     lisinopril 2.5 MG tablet  Commonly known as:  PRINIVIL,ZESTRIL  Take 2.5 mg by mouth daily.     loratadine-pseudoephedrine 10-240 MG per 24 hr tablet  Commonly known as:  CLARITIN-D 24-hour  Take 1 tablet by mouth daily.     metoCLOPramide 10 MG tablet  Commonly known as:  REGLAN  Take 10 mg by mouth 4 (four) times daily.  multivitamin with minerals tablet  Take 1 tablet by mouth daily.     OXYGEN-HELIUM IN  Inhale into the lungs.     promethazine 25 MG/ML injection  Commonly known as:  PHENERGAN  Inject 25 mg into the muscle every 6 (six) hours as needed for nausea or vomiting. Or po     QUEtiapine 25 MG tablet  Commonly known as:  SEROQUEL  Take 12.5 mg by mouth at bedtime.     sennosides-docusate sodium 8.6-50 MG tablet  Commonly known as:  SENOKOT-S  Take 1 tablet by mouth at bedtime.     traMADol 50 MG tablet  Commonly known as:  ULTRAM  Take two tablets by mouth at bedtime for pain     UTI-STAT Liqd  Take 30 mLs by mouth daily.     venlafaxine 75 MG tablet  Commonly known as:  EFFEXOR  Take 75 mg by mouth daily.     zolpidem 5 MG tablet   Commonly known as:  AMBIEN  Take one tablet at bedtime for sleep        Meds ordered this encounter  Medications  . promethazine (PHENERGAN) 25 MG/ML injection    Sig: Inject 25 mg into the muscle every 6 (six) hours as needed for nausea or vomiting. Or po  . ipratropium-albuterol (DUONEB) 0.5-2.5 (3) MG/3ML SOLN    Sig: Take 3 mLs by nebulization every 6 (six) hours as needed.  . lactulose (CHRONULAC) 10 GM/15ML solution    Sig: Take 20 g by mouth every 12 (twelve) hours.  . fluticasone (FLONASE) 50 MCG/ACT nasal spray    Sig: Place 1 spray into both nostrils 2 (two) times daily.  . ergocalciferol (VITAMIN D2) 50000 UNITS capsule    Sig: Take 50,000 Units by mouth every 30 (thirty) days.  Marland Kitchen lisinopril (PRINIVIL,ZESTRIL) 2.5 MG tablet    Sig: Take 2.5 mg by mouth daily.  Marland Kitchen lidocaine (LIDODERM) 5 %    Sig: Place 1 patch onto the skin daily. Remove & Discard patch within 12 hours or as directed by MD  . Cranberry-Vitamin C-Inulin (UTI-STAT) LIQD    Sig: Take 30 mLs by mouth daily.  . calcium carbonate (TUMS - DOSED IN MG ELEMENTAL CALCIUM) 500 MG chewable tablet    Sig: Chew 1 tablet by mouth daily.  . Multiple Vitamins-Minerals (MULTIVITAMIN WITH MINERALS) tablet    Sig: Take 1 tablet by mouth daily.  Marland Kitchen loratadine-pseudoephedrine (CLARITIN-D 24-HOUR) 10-240 MG per 24 hr tablet    Sig: Take 1 tablet by mouth daily.  . sennosides-docusate sodium (SENOKOT-S) 8.6-50 MG tablet    Sig: Take 1 tablet by mouth at bedtime.  Marland Kitchen QUEtiapine (SEROQUEL) 25 MG tablet    Sig: Take 12.5 mg by mouth at bedtime.  Marland Kitchen venlafaxine (EFFEXOR) 75 MG tablet    Sig: Take 75 mg by mouth daily.  . metoCLOPramide (REGLAN) 10 MG tablet    Sig: Take 10 mg by mouth 4 (four) times daily.  . baclofen (LIORESAL) 10 MG tablet    Sig: Take 10 mg by mouth 3 (three) times daily. An 1 prn between 12a-3a  . OXYGEN-HELIUM IN    Sig: Inhale into the lungs.  . Amino Acids-Protein Hydrolys (FEEDING SUPPLEMENT, PRO-STAT  SUGAR FREE 64,) LIQD    Sig: Take 30 mLs by mouth daily.    Immunization History  Administered Date(s) Administered  . Influenza-Unspecified 04/26/2013  . Pneumococcal-Unspecified 06/07/2008    History  Substance Use Topics  . Smoking status: Never Smoker   .  Smokeless tobacco: Never Used  . Alcohol Use: No     Comment: prior use    Review of Systems  DATA OBTAINED: from patient, nurse GENERAL: Feels well no fevers, fatigue, appetite changes SKIN: No itching, rash HEENT: No complaint RESPIRATORY: No cough, wheezing, SOB CARDIAC: No chest pain, palpitations, lower extremity edema  GI: No abdominal pain, No N/V/D or constipation, No heartburn or reflux  GU: No dysuria, frequency or urgency, or incontinence  MUSCULOSKELETAL: No unrelieved bone/joint pain NEUROLOGIC: No headache, dizziness  PSYCHIATRIC: No overt anxiety or sadness. Sleeps well.   Filed Vitals:   08/02/13 1516  BP: 129/87  Pulse: 78  Temp: 98.1 F (36.7 C)  Resp: 16    Physical Exam  GENERAL APPEARANCE: Alert, conversant. Appropriately groomed. No acute distress  SKIN: No diaphoresis rash, HEENT: Unremarkable RESPIRATORY: Breathing is even, unlabored. Lung sounds are clear   CARDIOVASCULAR: Heart RRR no murmurs, rubs or gallops. No peripheral edema  GASTROINTESTINAL: Abdomen is soft, non-tender, chronically  distended with ventral hernia w/ normal bowel sounds.  GENITOURINARY: Bladder non tender, not distended ; suprapubic catheter affixed to abd MUSCULOSKELETAL: contractures RUE and BLE NEUROLOGIC: movement LUE only PSYCHIATRIC: Mood and affect appropriate to situation, no behavioral issues  Patient Active Problem List   Diagnosis Date Noted  . CVA, old, hemiparesis 08/02/2013  . Chronic suprapubic catheter 08/02/2013  . Personal history of PE (pulmonary embolism) 08/02/2013  . Depression, major, recurrent 08/02/2013  . Pressure ulcer stage II 08/02/2013  . Anemia 08/02/2013  . Type II or  unspecified type diabetes mellitus with ophthalmic manifestations, not stated as uncontrolled(250.50)   . Delusional disorder(297.1)   . Paraplegia   . 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 Urinary-genital tract fistula, female   . Chronic kidney disease, stage III (moderate)   . Acute appendicitis with peritoneal abscess   . Calculus of lower urinary tract, unspecified   . Sicca syndrome   . Dysphagia, unspecified(787.20)   . Personal history of pulmonary embolism   . Neurogenic bladder, NOS   . Type II or unspecified type diabetes mellitus with neurological manifestations, not stated as uncontrolled(250.60)   . ABDOMINAL PAIN -GENERALIZED 05/27/2009  . MULTIPLE SCLEROSIS 04/19/2009  . CVA 04/19/2009  . OTHER FUNCTIONAL DISORDERS OF INTESTINE 04/19/2009        Assessment and Plan  MULTIPLE SCLEROSIS End stage- no therapy;poor prognosis; neurogenic bladder,dysphagia and visual impairment  Personal history of PE (pulmonary embolism) Chronic warfarin-will continue to monitor  Delusional disorder(297.1) Chronic ,stable;seroquel 12.5 mg to continue  Type II or unspecified type diabetes mellitus with neurological manifestations, not stated as uncontrolled(250.60) HbA1c 5.6 07/17/2013 on no meds  Depression, major, recurrent Will continue effexor  Pressure ulcer stage II RLE active again-wound care  Neurogenic bladder, NOS With chronic suprapubic catheter-has frequent UTIs, not at this time  Chronic kidney disease, stage III (moderate) Lisinopril 2.5 mg daily; 12/22 BUN/Cr 13/0.53   GFR >89  Anemia 12/22 H/H 11.5/35.4, gradual slt dec from prior    Margit Hanks, MD

## 2013-09-04 ENCOUNTER — Non-Acute Institutional Stay (SKILLED_NURSING_FACILITY): Payer: PRIVATE HEALTH INSURANCE | Admitting: Internal Medicine

## 2013-09-04 DIAGNOSIS — I69359 Hemiplegia and hemiparesis following cerebral infarction affecting unspecified side: Secondary | ICD-10-CM

## 2013-09-04 DIAGNOSIS — Z9350 Unspecified cystostomy status: Secondary | ICD-10-CM

## 2013-09-04 DIAGNOSIS — R1310 Dysphagia, unspecified: Secondary | ICD-10-CM

## 2013-09-04 DIAGNOSIS — L8992 Pressure ulcer of unspecified site, stage 2: Secondary | ICD-10-CM

## 2013-09-04 DIAGNOSIS — L8990 Pressure ulcer of unspecified site, unspecified stage: Secondary | ICD-10-CM

## 2013-09-04 DIAGNOSIS — I69959 Hemiplegia and hemiparesis following unspecified cerebrovascular disease affecting unspecified side: Secondary | ICD-10-CM

## 2013-09-04 DIAGNOSIS — Z9359 Other cystostomy status: Secondary | ICD-10-CM

## 2013-09-04 DIAGNOSIS — F339 Major depressive disorder, recurrent, unspecified: Secondary | ICD-10-CM

## 2013-09-04 DIAGNOSIS — N821 Other female urinary-genital tract fistulae: Secondary | ICD-10-CM

## 2013-09-04 DIAGNOSIS — G8220 Paraplegia, unspecified: Secondary | ICD-10-CM

## 2013-09-07 ENCOUNTER — Encounter: Payer: Self-pay | Admitting: Internal Medicine

## 2013-09-07 NOTE — Progress Notes (Signed)
MRN: [id number] Name: [name]  Sex: female Age: 78 y.o. DOB: Dec 02, 1935  [facility] #: [facility] Facility/Room: 105A Level Of Care: SNF Provider: Merrilee Seashore D Emergency Contacts: Extended Emergency Contact Information Primary Emergency Contact: CORBITT,GLADYS Address: [address]          [place]  [address] Home Phone: [phone] Relation: None  Code Status: DNR  Allergies: Strawberry and Sulfonamide derivatives  Chief Complaint  Patient presents with  . Medical Managment of Chronic Issues    HPI: Patient is 78 y.o. female who is being seen for routine problems.  Past Medical History  Diagnosis Date  . Type II or unspecified type diabetes mellitus with ophthalmic manifestations, not stated as uncontrolled   . Delusional disorder(297.1)   . Multiple sclerosis   . Paraplegia   . 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 Urinary-genital tract fistula, female   . Other functional disorders of intestine   . Chronic kidney disease, stage III (moderate)   . Acute appendicitis with peritoneal abscess   . Calculus of lower urinary tract, unspecified   . Sicca syndrome   . Dysphagia, unspecified(787.20)   . Personal history of pulmonary embolism   . Neurogenic bladder, NOS   . Type II or unspecified type diabetes mellitus with neurological manifestations, not stated as uncontrolled   . Multiple sclerosis   . Hip fracture   . Tibial fracture     History reviewed. No pertinent past surgical history.    Medication List       This list is accurate as of: 09/04/13 11:59 PM.  Always use your most recent med list.               baclofen 10 MG tablet  Commonly known as:  LIORESAL  Take 10 mg by mouth 3 (three) times daily. An 1 prn between 12a-3a     calcium carbonate 500 MG chewable tablet  Commonly known as:  TUMS - dosed in mg elemental calcium  Chew 1  tablet by mouth daily.     ergocalciferol 50000 UNITS capsule  Commonly known as:  VITAMIN D2  Take 50,000 Units by mouth every 30 (thirty) days.     feeding supplement (PRO-STAT SUGAR FREE 64) Liqd  Take 30 mLs by mouth daily.     fluticasone 50 MCG/ACT nasal spray  Commonly known as:  FLONASE  Place 1 spray into both nostrils 2 (two) times daily.     ipratropium-albuterol 0.5-2.5 (3) MG/3ML Soln  Commonly known as:  DUONEB  Take 3 mLs by nebulization every 6 (six) hours as needed.     lactulose 10 GM/15ML solution  Commonly known as:  CHRONULAC  Take 20 g by mouth every 12 (twelve) hours.     lidocaine 5 %  Commonly known as:  LIDODERM  Place 1 patch onto the skin daily. Remove & Discard patch within 12 hours or as directed by MD     lisinopril 2.5 MG tablet  Commonly known as:  PRINIVIL,ZESTRIL  Take 2.5 mg by mouth daily.     loratadine-pseudoephedrine 10-240 MG per 24 hr tablet  Commonly known as:  CLARITIN-D 24-hour  Take 1 tablet by mouth daily.     metoCLOPramide 10 MG tablet  Commonly known as:  REGLAN  Take 10 mg by mouth 4 (four) times daily.     multivitamin with minerals tablet  Take 1  tablet by mouth daily.     OXYGEN-HELIUM IN  Inhale into the lungs.     promethazine 25 MG/ML injection  Commonly known as:  PHENERGAN  Inject 25 mg into the muscle every 6 (six) hours as needed for nausea or vomiting. Or po     QUEtiapine 25 MG tablet  Commonly known as:  SEROQUEL  Take 12.5 mg by mouth at bedtime.     sennosides-docusate sodium 8.6-50 MG tablet  Commonly known as:  SENOKOT-S  Take 1 tablet by mouth at bedtime.     traMADol 50 MG tablet  Commonly known as:  ULTRAM  Take two tablets by mouth at bedtime for pain     UTI-STAT Liqd  Take 30 mLs by mouth daily.     venlafaxine 75 MG tablet  Commonly known as:  EFFEXOR  Take 75 mg by mouth daily.     zolpidem 5 MG tablet  Commonly known as:  AMBIEN  Take one tablet at bedtime for sleep         No orders of the defined types were placed in this encounter.    Immunization History  Administered Date(s) Administered  . Influenza-Unspecified 04/26/2013  . Pneumococcal-Unspecified 06/07/2008    History  Substance Use Topics  . Smoking status: Never Smoker   . Smokeless tobacco: Never Used  . Alcohol Use: No     Comment: prior use    Review of Systems  DATA OBTAINED: from patient GENERAL: Feels well no fevers, fatigue, appetite changes SKIN: No itching, rash HEENT: No complaint RESPIRATORY: No cough, wheezing, SOB CARDIAC: No chest pain, palpitations, lower extremity edema  GI: No abdominal pain, No N/V/D or constipation, No heartburn or reflux  GU: No dysuria, frequency or urgency, or incontinence  MUSCULOSKELETAL: No unrelieved bone/joint pain NEUROLOGIC: No headache, dizziness or focal weakness PSYCHIATRIC: No overt anxiety or sadness; C/O NOT SLEEPING WELL  Filed Vitals:   09/07/13 1149  BP: 136/74  Pulse: 81  Temp: 97.6 F (36.4 C)  Resp: 20    Physical Exam  GENERAL APPEARANCE: Alert, conversant. Appropriately groomed. No acute distress  SKIN: No diaphoresis rash;wound rle HEENT: Unremarkable RESPIRATORY: Breathing is even, unlabored. Lung sounds are clear   CARDIOVASCULAR: Heart RRR no murmurs, rubs or gallops. TRACE peripheral edema  GASTROINTESTINAL: Abdomen is soft, non-tender, not distended w/ normal bowel sounds.  GENITOURINARY: Bladder non tender, not distended  MUSCULOSKELETAL: contractures NEUROLOGIC: Cranial nerves 2-12 grossly intact. PSYCHIATRIC: Mood and affect appropriate to situation, no behavioral issues  Patient Active Problem List   Diagnosis Date Noted  . CVA, old, hemiparesis 08/02/2013  . Chronic suprapubic catheter 08/02/2013  . Personal history of PE (pulmonary embolism) 08/02/2013  . Depression, major, recurrent 08/02/2013  . Pressure ulcer stage II 08/02/2013  . Anemia 08/02/2013  . Type II or unspecified type  diabetes mellitus with ophthalmic manifestations, not stated as uncontrolled(250.50)   . Delusional disorder(297.1)   . Paraplegia   . 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 Urinary-genital tract fistula, female   . Chronic kidney disease, stage III (moderate)   . Acute appendicitis with peritoneal abscess   . Calculus of lower urinary tract, unspecified   . Sicca syndrome   . Dysphagia, unspecified(787.20)   . Personal history of pulmonary embolism   . Neurogenic bladder, NOS   . Type II or unspecified type diabetes mellitus with neurological manifestations, not  stated as uncontrolled(250.60)   . ABDOMINAL PAIN -GENERALIZED 05/27/2009  . MULTIPLE SCLEROSIS 04/19/2009  . CVA 04/19/2009  . OTHER FUNCTIONAL DISORDERS OF INTESTINE 04/19/2009        Assessment and Plan  Depression, major, recurrent C/o can't sleep. Get seroquel and ambien and effexoxor  at night but sleeps all day long. Should we try melatonin?  Chronic suprapubic catheter Chronic and stable with cystotomy. Urine checked when it appears there is infection.  Urinary-genital tract fistula, female No change in the fistula.Care is taken to adhere catheter to abdomen to keep pressure off fistula.  Dysphagia, unspecified(787.20) Cronic, ongoing;no reported recent episodes of chocking,Will continue to mionitor.  Paraplegia Chronic, stable;Lidoderm patch,  Tylenol and tramadol prn for pain.  CVA, old, hemiparesis No change; pt on baclofen for contractures  Pressure ulcer stage II ABI study done on RLE and it did not show significant disease;continue wound care    Margit Hanks, MD

## 2013-09-07 NOTE — Assessment & Plan Note (Signed)
Cronic, ongoing;no reported recent episodes of chocking,Will continue to mionitor.

## 2013-09-07 NOTE — Assessment & Plan Note (Signed)
ABI study done on RLE and it did not show significant disease;continue wound care

## 2013-09-07 NOTE — Assessment & Plan Note (Addendum)
No change; pt on baclofen for contractures

## 2013-09-07 NOTE — Assessment & Plan Note (Signed)
No change in the fistula.Care is taken to adhere catheter to abdomen to keep pressure off fistula.

## 2013-09-07 NOTE — Assessment & Plan Note (Signed)
Chronic and stable with cystotomy. Urine checked when it appears there is infection.

## 2013-09-07 NOTE — Assessment & Plan Note (Addendum)
Chronic, stable;Lidoderm patch,  Tylenol and tramadol prn for pain.

## 2013-09-07 NOTE — Assessment & Plan Note (Signed)
C/o can't sleep. Get seroquel and ambien and effexoxor  at night but sleeps all day long. Should we try melatonin?

## 2013-10-05 ENCOUNTER — Other Ambulatory Visit: Payer: Self-pay | Admitting: *Deleted

## 2013-10-05 MED ORDER — TRAMADOL HCL 50 MG PO TABS: ORAL_TABLET | ORAL | Status: DC

## 2013-11-20 ENCOUNTER — Encounter: Payer: Self-pay | Admitting: Internal Medicine

## 2013-11-20 ENCOUNTER — Non-Acute Institutional Stay (SKILLED_NURSING_FACILITY): Payer: PRIVATE HEALTH INSURANCE | Admitting: Internal Medicine

## 2013-11-20 DIAGNOSIS — F22 Delusional disorders: Secondary | ICD-10-CM

## 2013-11-20 DIAGNOSIS — F339 Major depressive disorder, recurrent, unspecified: Secondary | ICD-10-CM

## 2013-11-20 DIAGNOSIS — Z9350 Unspecified cystostomy status: Secondary | ICD-10-CM

## 2013-11-20 DIAGNOSIS — N183 Chronic kidney disease, stage 3 unspecified: Secondary | ICD-10-CM

## 2013-11-20 DIAGNOSIS — I10 Essential (primary) hypertension: Secondary | ICD-10-CM | POA: Insufficient documentation

## 2013-11-20 DIAGNOSIS — E1149 Type 2 diabetes mellitus with other diabetic neurological complication: Secondary | ICD-10-CM

## 2013-11-20 DIAGNOSIS — R1310 Dysphagia, unspecified: Secondary | ICD-10-CM

## 2013-11-20 DIAGNOSIS — G8220 Paraplegia, unspecified: Secondary | ICD-10-CM

## 2013-11-20 DIAGNOSIS — Z9359 Other cystostomy status: Secondary | ICD-10-CM

## 2013-11-20 NOTE — Assessment & Plan Note (Signed)
No respiratory infections

## 2013-11-20 NOTE — Assessment & Plan Note (Signed)
Controled with seroquel

## 2013-11-20 NOTE — Assessment & Plan Note (Signed)
Reasonable control with lisinopril 2.5; would inc to 5 mg daily

## 2013-11-20 NOTE — Assessment & Plan Note (Signed)
GFR 136;CrCl-110

## 2013-11-20 NOTE — Assessment & Plan Note (Signed)
Sleeping better;on [place], seroquel, effexor

## 2013-11-20 NOTE — Assessment & Plan Note (Signed)
Has ben successful in preventing migration into fistula; did hve UTI, resolvefd

## 2013-11-20 NOTE — Progress Notes (Signed)
MRN: [id number] Name: [name]  Sex: female Age: 78 y.o. DOB: April 20, 1936  [place] #: [facility] Facility/Room: 105A Level Of Care: SNF Provider: Hennie Duos Emergency Contacts: Extended Emergency Contact Information Primary Emergency Contact: CORBITT,GLADYS Address: [address]          [place] Home Phone: [phone] Relation: None  Code Status: DNR   Allergies: Strawberry and Sulfonamide derivatives  Chief Complaint  Patient presents with  . Medical Management of Chronic Issues    HPI: Patient is 78 y.o. female who is being seen for routine problems.  Past Medical History  Diagnosis Date  . Type II or unspecified type diabetes mellitus with ophthalmic manifestations, not stated as uncontrolled   . Delusional disorder(297.1)   . Multiple sclerosis   . Paraplegia   . 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 Urinary-genital tract fistula, female   . Other functional disorders of intestine   . Chronic kidney disease, stage III (moderate)   . Acute appendicitis with peritoneal abscess   . Calculus of lower urinary tract, unspecified   . Sicca syndrome   . Dysphagia, unspecified(787.20)   . Personal history of pulmonary embolism   . Neurogenic bladder, NOS   . Type II or unspecified type diabetes mellitus with neurological manifestations, not stated as uncontrolled   . Multiple sclerosis   . Hip fracture   . Tibial fracture     History reviewed. No pertinent past surgical history.    Medication List       This list is accurate as of: 11/20/13  9:43 PM.  Always use your most recent med list.               baclofen 10 MG tablet  Commonly known as:  LIORESAL  Take 10 mg by mouth 3 (three) times daily. An 1 prn between 12a-3a     calcium carbonate 500 MG chewable tablet  Commonly known as:  TUMS - dosed in mg elemental calcium  Chew  1 tablet by mouth daily.     ergocalciferol 50000 UNITS capsule  Commonly known as:  VITAMIN D2  Take 50,000 Units by mouth every 30 (thirty) days.     feeding supplement (PRO-STAT SUGAR FREE 64) Liqd  Take 30 mLs by mouth daily.     fluticasone 50 MCG/ACT nasal spray  Commonly known as:  FLONASE  Place 1 spray into both nostrils 2 (two) times daily.     ipratropium-albuterol 0.5-2.5 (3) MG/3ML Soln  Commonly known as:  DUONEB  Take 3 mLs by nebulization every 6 (six) hours as needed.     lactulose 10 GM/15ML solution  Commonly known as:  CHRONULAC  Take 20 g by mouth every 12 (twelve) hours.     lidocaine 5 %  Commonly known as:  LIDODERM  Place 1 patch onto the skin daily. Remove & Discard patch within 12 hours or as directed by MD     lisinopril 2.5 MG tablet  Commonly known as:  PRINIVIL,ZESTRIL  Take 2.5 mg by mouth daily.     loratadine-pseudoephedrine 10-240 MG per 24 hr tablet  Commonly known as:  CLARITIN-D 24-hour  Take 1 tablet by mouth daily.     metoCLOPramide 10 MG tablet  Commonly known as:  REGLAN  Take 10 mg by mouth 4 (four) times daily.     multivitamin with minerals tablet  Take 1 tablet by mouth daily.     OXYGEN-HELIUM IN  Inhale into the lungs.     promethazine 25 MG/ML injection  Commonly known as:  PHENERGAN  Inject 25 mg into the muscle every 6 (six) hours as needed for nausea or vomiting. Or po     QUEtiapine 25 MG tablet  Commonly known as:  SEROQUEL  Take 12.5 mg by mouth at bedtime.     sennosides-docusate sodium 8.6-50 MG tablet  Commonly known as:  SENOKOT-S  Take 1 tablet by mouth at bedtime.     traMADol 50 MG tablet  Commonly known as:  ULTRAM  Take two tablets by mouth at bedtime for pain     UTI-STAT Liqd  Take 30 mLs by mouth daily.     venlafaxine 75 MG tablet  Commonly known as:  EFFEXOR  Take 75 mg by mouth daily.     zolpidem 5 MG tablet  Commonly known as:  AMBIEN  Take one tablet at bedtime for sleep         No orders of the defined types were placed in this encounter.    Immunization History  Administered Date(s) Administered  . Influenza-Unspecified 04/26/2013  . Pneumococcal-Unspecified 06/07/2008    History  Substance Use Topics  . Smoking status: Never Smoker   . Smokeless tobacco: Never Used  . Alcohol Use: No     Comment: prior use    Review of Systems  DATA OBTAINED: from patient; no c/o GENERAL: Feels well no fevers, fatigue, appetite changes SKIN: No itching, rash HEENT: No complaint RESPIRATORY: No cough, wheezing, SOB CARDIAC: No chest pain, palpitations, lower extremity edema  GI: No abdominal pain, No N/V/D or constipation, No heartburn or reflux  GU: No dysuria, frequency or urgency, or incontinence  MUSCULOSKELETAL: No unrelieved bone/joint pain NEUROLOGIC: No headache, dizziness or focal weakness PSYCHIATRIC: No overt anxiety or sadness. Sleeps well.   Filed Vitals:   11/20/13 2125  BP: 143/95  Pulse: 76  Temp: 97.7 F (36.5 C)  Resp: 20    Physical Exam  GENERAL APPEARANCE: Alert, mod conversant. Appropriately groomed. No acute distress  SKIN: No diaphoresis rash HEENT: Unremarkable RESPIRATORY: Breathing is even, unlabored. Lung sounds are clear   CARDIOVASCULAR: Heart RRR no murmurs, rubs or gallops. No peripheral edema  GASTROINTESTINAL: Abdomen is soft, non-tender, not distended w/ normal bowel sounds.  GENITOURINARY: suprapubic catheter MUSCULOSKELETAL: quad,contactures NEUROLOGIC: Cranial nerves 2-12 grossly intact; quad PSYCHIATRIC: Mood and affect appropriate to situation, no behavioral issues  Patient Active Problem List   Diagnosis Date Noted  . HTN (hypertension) 11/20/2013  . CVA, old, hemiparesis 08/02/2013  . Chronic suprapubic catheter 08/02/2013  . Personal history of PE (pulmonary embolism) 08/02/2013  . Depression, major, recurrent 08/02/2013  . Pressure ulcer stage II 08/02/2013  . Anemia 08/02/2013  . Type II or  unspecified type diabetes mellitus with ophthalmic manifestations, not stated as uncontrolled(250.50)   . Delusional disorder(297.1)   . Paraplegia   . 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 Urinary-genital tract fistula, female   . Chronic kidney disease, stage III (moderate)   . Acute appendicitis with peritoneal abscess   . Calculus of lower urinary tract, unspecified   . Sicca syndrome   . Dysphagia, unspecified(787.20)   . Personal history of pulmonary embolism   . Neurogenic bladder, NOS   . Type II or unspecified type diabetes  mellitus with neurological manifestations, not stated as uncontrolled(250.60)   . ABDOMINAL PAIN -GENERALIZED 05/27/2009  . MULTIPLE SCLEROSIS 04/19/2009  . CVA 04/19/2009  . OTHER FUNCTIONAL DISORDERS OF INTESTINE 04/19/2009    CBC    Component Value Date/Time   WBC 8.2 05/04/2010 1729   RBC 4.79 05/04/2010 1729   HGB 12.6 05/04/2010 1729   HCT 39.3 05/04/2010 1729   PLT 318 05/04/2010 1729   MCV 82.0 05/04/2010 1729   LYMPHSABS 2.4 05/04/2010 1729   MONOABS 0.7 05/04/2010 1729   EOSABS 0.3 05/04/2010 1729   BASOSABS 0.0 05/04/2010 1729    CMP     Component Value Date/Time   NA 137 05/04/2010 1729   K 3.9 05/04/2010 1729   CL 104 05/04/2010 1729   CO2 25 05/04/2010 1729   GLUCOSE 92 05/04/2010 1729   BUN 8 05/04/2010 1729   CREATININE 0.47 05/04/2010 1729   CALCIUM 9.0 05/04/2010 1729   PROT 8.3 05/04/2010 1729   ALBUMIN 3.1* 05/04/2010 1729   AST 28 05/04/2010 1729   ALT 18 05/04/2010 1729   ALKPHOS 69 05/04/2010 1729   BILITOT 0.3 05/04/2010 1729   GFRNONAA >60 05/04/2010 1729   GFRAA  Value: >60        The eGFR has been calculated using the MDRD equation. This calculation has not been validated in all clinical situations. eGFR's persistently <60 mL/min signify possible Chronic Kidney Disease. 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

## 2013-11-20 NOTE — Assessment & Plan Note (Signed)
Chronic, unchanged, no new contractures

## 2013-11-20 NOTE — Assessment & Plan Note (Signed)
A1c 5.6 on no meds;on Ace

## 2013-12-06 ENCOUNTER — Other Ambulatory Visit: Payer: Self-pay | Admitting: *Deleted

## 2013-12-06 MED ORDER — ZOLPIDEM TARTRATE 5 MG PO TABS: ORAL_TABLET | ORAL | Status: DC

## 2013-12-06 NOTE — Telephone Encounter (Signed)
[facility] 

## 2014-04-09 ENCOUNTER — Other Ambulatory Visit: Payer: Self-pay | Admitting: *Deleted

## 2014-04-09 MED ORDER — TRAMADOL HCL 50 MG PO TABS: ORAL_TABLET | ORAL | Status: DC

## 2014-04-09 NOTE — Telephone Encounter (Signed)
[facility] 

## 2014-05-09 ENCOUNTER — Encounter: Payer: Self-pay | Admitting: Internal Medicine

## 2014-05-09 ENCOUNTER — Non-Acute Institutional Stay (SKILLED_NURSING_FACILITY): Payer: PRIVATE HEALTH INSURANCE | Admitting: Internal Medicine

## 2014-05-09 DIAGNOSIS — M3500 Sicca syndrome, unspecified: Secondary | ICD-10-CM

## 2014-05-09 DIAGNOSIS — G35 Multiple sclerosis: Secondary | ICD-10-CM

## 2014-05-09 DIAGNOSIS — R1312 Dysphagia, oropharyngeal phase: Secondary | ICD-10-CM

## 2014-05-09 DIAGNOSIS — G8220 Paraplegia, unspecified: Secondary | ICD-10-CM

## 2014-05-09 DIAGNOSIS — F22 Delusional disorders: Secondary | ICD-10-CM

## 2014-05-09 DIAGNOSIS — F330 Major depressive disorder, recurrent, mild: Secondary | ICD-10-CM

## 2014-05-09 DIAGNOSIS — N319 Neuromuscular dysfunction of bladder, unspecified: Secondary | ICD-10-CM

## 2014-05-09 NOTE — Assessment & Plan Note (Signed)
16 fr suprapubic catheter, and oxybutanin and UTIstat; followed by [facility] prn

## 2014-05-09 NOTE — Assessment & Plan Note (Signed)
Due to MS, chronic and unchanged, full assist with ADL's

## 2014-05-09 NOTE — Assessment & Plan Note (Signed)
No sx o50f progression;sx of dry eyes is controlled with drops

## 2014-05-09 NOTE — Assessment & Plan Note (Signed)
H/H 11/2013 11.9/35.9, stable; pt on Xarelto;repeat anemia studies

## 2014-05-09 NOTE — Assessment & Plan Note (Signed)
Chronic and sablet on effexor; monitor for new depressive sx

## 2014-05-09 NOTE — Assessment & Plan Note (Signed)
No recent c/o recurrent delusions;controlled on seroquel

## 2014-05-09 NOTE — Assessment & Plan Note (Signed)
Chronic and stable with reg diet, no recent aspirations

## 2014-05-09 NOTE — Assessment & Plan Note (Signed)
Advanced, sx control with baclofen,ultram,tylenol,lidoderm patch;pt with neurogenic bladder,dysphagia, visual impairment

## 2014-05-09 NOTE — Progress Notes (Signed)
MRN: [id number] Name: [name]  Sex: female Age: 78 y.o. DOB: [birth year]  [place] #: [facility] Facility/Room: 105 Level Of Care: SNF Provider: Inocencio Homes D Emergency Contacts: Extended Emergency Contact Information Primary Emergency Contact: CORBITT,GLADYS Address: [address]          [place]  [place] Phone: [phone] Relation: None  Code Status: DNR  Allergies: Strawberry and Sulfonamide derivatives  Chief Complaint  Patient presents with  . Medical Management of Chronic Issues    HPI: Patient is 78 y.o. female who is being seen with routine problems.  Past Medical History  Diagnosis Date  . Type II or unspecified type diabetes mellitus with ophthalmic manifestations, not stated as uncontrolled   . Delusional disorder(297.1)   . Multiple sclerosis   . Paraplegia   . 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 Urinary-genital tract fistula, female   . Other functional disorders of intestine   . Chronic kidney disease, stage III (moderate)   . Acute appendicitis with peritoneal abscess   . Calculus of lower urinary tract, unspecified   . Sicca syndrome   . Dysphagia, unspecified(787.20)   . Personal history of pulmonary embolism   . Neurogenic bladder, NOS   . Type II or unspecified type diabetes mellitus with neurological manifestations, not stated as uncontrolled   . Multiple sclerosis   . Hip fracture   . Tibial fracture     History reviewed. No pertinent past surgical history.    Medication List       This list is accurate as of: 05/09/14  9:58 AM.  Always use your most recent med list.               baclofen 10 MG tablet  Commonly known as:  LIORESAL  Take 10 mg by mouth 3 (three) times daily. An 1 prn between 12a-3a     calcium carbonate 500 MG chewable tablet  Commonly known as:  TUMS - dosed in mg elemental calcium   Chew 1 tablet by mouth daily.     ergocalciferol 50000 UNITS capsule  Commonly known as:  VITAMIN D2  Take 50,000 Units by mouth every 30 (thirty) days.     feeding supplement (PRO-STAT SUGAR FREE 64) Liqd  Take 30 mLs by mouth daily.     fluticasone 50 MCG/ACT nasal spray  Commonly known as:  FLONASE  Place 1 spray into both nostrils 2 (two) times daily.     ipratropium-albuterol 0.5-2.5 (3) MG/3ML Soln  Commonly known as:  DUONEB  Take 3 mLs by nebulization every 6 (six) hours as needed.     lactulose 10 GM/15ML solution  Commonly known as:  CHRONULAC  Take 20 g by mouth every 12 (twelve) hours.     lidocaine 5 %  Commonly known as:  LIDODERM  Place 1 patch onto the skin daily. Remove & Discard patch within 12 hours or as directed by MD     lisinopril 2.5 MG tablet  Commonly known as:  PRINIVIL,ZESTRIL  Take 2.5 mg by mouth daily.     loratadine-pseudoephedrine 10-240 MG per 24 hr tablet  Commonly known as:  CLARITIN-D 24-hour  Take 1 tablet by mouth daily.     metoCLOPramide 10 MG tablet  Commonly known as:  REGLAN  Take 10 mg by mouth 4 (four) times daily.     multivitamin with minerals tablet  Take  1 tablet by mouth daily.     OXYGEN  Inhale into the lungs.     promethazine 25 MG/ML injection  Commonly known as:  PHENERGAN  Inject 25 mg into the muscle every 6 (six) hours as needed for nausea or vomiting. Or po     QUEtiapine 25 MG tablet  Commonly known as:  SEROQUEL  Take 12.5 mg by mouth at bedtime.     sennosides-docusate sodium 8.6-50 MG tablet  Commonly known as:  SENOKOT-S  Take 1 tablet by mouth at bedtime.     traMADol 50 MG tablet  Commonly known as:  ULTRAM  Take two tablets by mouth at bedtime. Hold for excessive sedation     UTI-STAT Liqd  Take 30 mLs by mouth daily.     venlafaxine 75 MG tablet  Commonly known as:  EFFEXOR  Take 75 mg by mouth daily.     zolpidem 5 MG tablet  Commonly known as:  AMBIEN  Take one tablet at  bedtime for sleep        No orders of the defined types were placed in this encounter.    Immunization History  Administered Date(s) Administered  . Influenza-Unspecified 04/26/2013  . Pneumococcal-Unspecified 06/07/2008    History  Substance Use Topics  . Smoking status: Never Smoker   . Smokeless tobacco: Never Used  . Alcohol Use: No     Comment: prior use    Review of Systems  DATA OBTAINED: from patient GENERAL:  no fevers, fatigue, appetite changes SKIN: no rash or itching HEENT: No complaint RESPIRATORY: No cough, wheezing, SOB CARDIAC: No chest pain, palpitations, lower extremity edema  GI: No abdominal pain, No N/V/D or constipation, No heartburn or reflux  GU: No dysuria, frequency or urgency, or incontinence  MUSCULOSKELETAL: No unrelieved bone/joint pain NEUROLOGIC: No headache, dizziness  PSYCHIATRIC: No overt anxiety or sadness  Filed Vitals:   05/09/14 0936  BP: 123/65  Pulse: 73  Temp: 96.9 F (36.1 C)  Resp: 18    Physical Exam  GENERAL APPEARANCE: Alert, conversant, No acute distress  SKIN: BLE vitiligo HEENT: Unremarkable RESPIRATORY: Breathing is even, unlabored. Lung sounds are clear   CARDIOVASCULAR: Heart irreg, no murmurs, rubs or gallops. No peripheral edema  GASTROINTESTINAL: Abdomen is soft, non-tender, not distended w/ normal bowel sounds.  GENITOURINARY: Bladder non tender, not distended  MUSCULOSKELETAL: R contractures NEUROLOGIC: Cranial nerves 2-12 grossly intact; r side weakness PSYCHIATRIC: Mood and affect appropriate to situation, no behavioral issues  Patient Active Problem List   Diagnosis Date Noted  . HTN (hypertension) 11/20/2013  . CVA, old, hemiparesis 08/02/2013  . Chronic suprapubic catheter 08/02/2013  . Personal history of PE (pulmonary embolism) 08/02/2013  . Depression, major, recurrent 08/02/2013  . Pressure ulcer stage II 08/02/2013  . Anemia of chronic disease 08/02/2013  . Type II or unspecified  type diabetes mellitus with ophthalmic manifestations, not stated as uncontrolled(250.50)   . Delusional disorder   . Paraplegia   . 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 Urinary-genital tract fistula, female   . Chronic kidney disease, stage III (moderate)   . Acute appendicitis with peritoneal abscess   . Calculus of lower urinary tract, unspecified   . Sicca syndrome   . Oropharyngeal dysphagia   . Personal history of pulmonary embolism   . Neurogenic bladder   . Type II or unspecified type diabetes mellitus with  neurological manifestations, not stated as uncontrolled(250.60)   . ABDOMINAL PAIN -GENERALIZED 05/27/2009  . MULTIPLE SCLEROSIS 04/19/2009  . CVA 04/19/2009  . OTHER FUNCTIONAL DISORDERS OF INTESTINE 04/19/2009    CBC    Component Value Date/Time   WBC 8.2 05/04/2010 1729   RBC 4.79 05/04/2010 1729   HGB 12.6 05/04/2010 1729   HCT 39.3 05/04/2010 1729   PLT 318 05/04/2010 1729   MCV 82.0 05/04/2010 1729   LYMPHSABS 2.4 05/04/2010 1729   MONOABS 0.7 05/04/2010 1729   EOSABS 0.3 05/04/2010 1729   BASOSABS 0.0 05/04/2010 1729    CMP     Component Value Date/Time   NA 137 05/04/2010 1729   K 3.9 05/04/2010 1729   CL 104 05/04/2010 1729   CO2 25 05/04/2010 1729   GLUCOSE 92 05/04/2010 1729   BUN 8 05/04/2010 1729   CREATININE 0.47 05/04/2010 1729   CALCIUM 9.0 05/04/2010 1729   PROT 8.3 05/04/2010 1729   ALBUMIN 3.1* 05/04/2010 1729   AST 28 05/04/2010 1729   ALT 18 05/04/2010 1729   ALKPHOS 69 05/04/2010 1729   BILITOT 0.3 05/04/2010 1729   GFRNONAA >60 05/04/2010 1729   GFRAA  Value: >60        The eGFR has been calculated using the MDRD equation. This calculation has not been validated in all clinical situations. eGFR's persistently <60 mL/min signify possible Chronic Kidney Disease. 05/04/2010 1729    Assessment and Plan  MULTIPLE SCLEROSIS Advanced, sx control with  baclofen,ultram,tylenol,lidoderm patch;pt with neurogenic bladder,dysphagia, visual impairment  Paraplegia Due to MS, chronic and unchanged, full assist with ADL's  Sicca syndrome No sx o15fprogression;sx of dry eyes is controlled with drops  Neurogenic bladder 16 fr suprapubic catheter, and oxybutanin and UTIstat; followed by [facility] prn  Oropharyngeal dysphagia Chronic and stable with reg diet, no recent aspirations  Depression, major, recurrent Chronic and sablet on effexor; monitor for new depressive sx  Delusional disorder No recent c/o recurrent delusions;controlled on seroquel  Anemia of chronic disease H/H 11/2013 11.9/35.9, stable; pt on Xarelto;repeat anemia studies    AHennie Duos MD

## 2014-06-05 ENCOUNTER — Other Ambulatory Visit: Payer: Self-pay | Admitting: *Deleted

## 2014-06-05 MED ORDER — ZOLPIDEM TARTRATE 5 MG PO TABS: ORAL_TABLET | ORAL | Status: DC

## 2014-06-05 NOTE — Telephone Encounter (Signed)
[facility] 

## 2014-08-02 ENCOUNTER — Non-Acute Institutional Stay (SKILLED_NURSING_FACILITY): Payer: Medicare Other | Admitting: Internal Medicine

## 2014-08-02 DIAGNOSIS — E118 Type 2 diabetes mellitus with unspecified complications: Secondary | ICD-10-CM

## 2014-08-02 DIAGNOSIS — N3000 Acute cystitis without hematuria: Secondary | ICD-10-CM

## 2014-08-02 DIAGNOSIS — M150 Primary generalized (osteo)arthritis: Secondary | ICD-10-CM

## 2014-08-02 DIAGNOSIS — D638 Anemia in other chronic diseases classified elsewhere: Secondary | ICD-10-CM

## 2014-08-02 DIAGNOSIS — G8220 Paraplegia, unspecified: Secondary | ICD-10-CM

## 2014-08-02 DIAGNOSIS — F330 Major depressive disorder, recurrent, mild: Secondary | ICD-10-CM

## 2014-08-02 DIAGNOSIS — I739 Peripheral vascular disease, unspecified: Secondary | ICD-10-CM

## 2014-08-02 DIAGNOSIS — N183 Chronic kidney disease, stage 3 unspecified: Secondary | ICD-10-CM

## 2014-08-02 DIAGNOSIS — Z86711 Personal history of pulmonary embolism: Secondary | ICD-10-CM

## 2014-08-02 DIAGNOSIS — G35 Multiple sclerosis: Secondary | ICD-10-CM

## 2014-08-02 DIAGNOSIS — M159 Polyosteoarthritis, unspecified: Secondary | ICD-10-CM

## 2014-08-02 NOTE — Progress Notes (Signed)
MRN: [id number] Name: [name]  Sex: female Age: 79 y.o. DOB: 1935/11/09  [place] [facility] Facility/Room:105A Level Of Care: SNF Provider: Inocencio Homes D Emergency Contacts: Extended Emergency Contact Information Primary Emergency Contact: CORBITT,GLADYS Address: [place]          [place]  [place] Phone: [phone] Relation: None  Code Status: DNR  Allergies: Strawberry and Sulfonamide derivatives  Chief Complaint  Patient presents with  . Medical Management of Chronic Issues    HPI: Patient is 79 y.o. female who is being seen for routine issues.  Past Medical History  Diagnosis Date  . Type II or unspecified type diabetes mellitus with ophthalmic manifestations, not stated as uncontrolled   . Delusional disorder(297.1)   . Multiple sclerosis   . Paraplegia   . 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 Urinary-genital tract fistula, female   . Other functional disorders of intestine   . Chronic kidney disease, stage III (moderate)   . Acute appendicitis with peritoneal abscess   . Calculus of lower urinary tract, unspecified   . Sicca syndrome   . Dysphagia, unspecified(787.20)   . Personal history of pulmonary embolism   . Neurogenic bladder, NOS   . Type II or unspecified type diabetes mellitus with neurological manifestations, not stated as uncontrolled   . Multiple sclerosis   . Hip fracture   . Tibial fracture     History reviewed. No pertinent past surgical history.    Medication List       This list is accurate as of: 08/02/14 11:59 PM.  Always use your most recent med list.               baclofen 10 MG tablet  Commonly known as:  LIORESAL  Take 10 mg by mouth 3 (three) times daily. An 1 prn between 12a-3a     calcium carbonate 500 MG chewable tablet  Commonly known as:  TUMS - dosed in mg elemental calcium  Chew 1  tablet by mouth daily.     ergocalciferol 50000 UNITS capsule  Commonly known as:  VITAMIN D2  Take 50,000 Units by mouth every 30 (thirty) days.     feeding supplement (PRO-STAT SUGAR FREE 64) Liqd  Take 30 mLs by mouth daily.     fluticasone 50 MCG/ACT nasal spray  Commonly known as:  FLONASE  Place 1 spray into both nostrils 2 (two) times daily.     ipratropium-albuterol 0.5-2.5 (3) MG/3ML Soln  Commonly known as:  DUONEB  Take 3 mLs by nebulization every 6 (six) hours as needed.     lactulose 10 GM/15ML solution  Commonly known as:  CHRONULAC  Take 20 g by mouth every 12 (twelve) hours.     lidocaine 5 %  Commonly known as:  LIDODERM  Place 1 patch onto the skin daily. Remove & Discard patch within 12 hours or as directed by MD     lisinopril 2.5 MG tablet  Commonly known as:  PRINIVIL,ZESTRIL  Take 2.5 mg by mouth daily.     loratadine-pseudoephedrine 10-240 MG per 24 hr tablet  Commonly known as:  CLARITIN-D 24-hour  Take 1 tablet by mouth daily.     metoCLOPramide 10 MG tablet  Commonly known as:  REGLAN  Take 10 mg by mouth 4 (four) times daily.     multivitamin with minerals tablet  Take 1 tablet  by mouth daily.     OXYGEN  Inhale into the lungs.     promethazine 25 MG/ML injection  Commonly known as:  PHENERGAN  Inject 25 mg into the muscle every 6 (six) hours as needed for nausea or vomiting. Or po     QUEtiapine 25 MG tablet  Commonly known as:  SEROQUEL  Take 12.5 mg by mouth at bedtime.     sennosides-docusate sodium 8.6-50 MG tablet  Commonly known as:  SENOKOT-S  Take 1 tablet by mouth at bedtime.     traMADol 50 MG tablet  Commonly known as:  ULTRAM  Take two tablets by mouth at bedtime. Hold for excessive sedation     UTI-STAT Liqd  Take 30 mLs by mouth daily.     venlafaxine 75 MG tablet  Commonly known as:  EFFEXOR  Take 75 mg by mouth daily.     zolpidem 5 MG tablet  Commonly known as:  AMBIEN  Take one tablet by mouth at  bedtime for rest        No orders of the defined types were placed in this encounter.    Immunization History  Administered Date(s) Administered  . Influenza-Unspecified 04/26/2013  . Pneumococcal-Unspecified 06/07/2008    History  Substance Use Topics  . Smoking status: Never Smoker   . Smokeless tobacco: Never Used  . Alcohol Use: No     Comment: prior use    Review of Systems  DATA OBTAINED: from patient; no c/o GENERAL:  no fevers, fatigue, appetite changes SKIN: No itching, rash HEENT: No complaint RESPIRATORY: No cough, wheezing, SOB CARDIAC: No chest pain, palpitations, lower extremity edema  GI: No abdominal pain, No N/V/D or constipation, No heartburn or reflux  GU: No dysuria, frequency or urgency, or incontinence  MUSCULOSKELETAL: No unrelieved bone/joint pain NEUROLOGIC: No headache, dizziness  PSYCHIATRIC: No overt anxiety or sadness  Filed Vitals:   08/02/14 2031  BP: 104/75  Pulse: 80  Temp: 97.3 F (36.3 C)  Resp: 18    Physical Exam  GENERAL APPEARANCE: Alert, conversant, No acute distressBF in WC  SKIN: No diaphoresis rash, or wounds HEENT: Unremarkable RESPIRATORY: Breathing is even, unlabored. Lung sounds are clear   CARDIOVASCULAR: Heart RRR no murmurs, rubs or gallops. No peripheral edema  GASTROINTESTINAL: Abdomen is soft, non-tender, not distended w/ normal bowel sounds.  GENITOURINARY: Bladder non tender, not distended  MUSCULOSKELETAL: paraplegia NEUROLOGIC: Cranial nerves 2-12 grossly intact PSYCHIATRIC:,no behaviors today  Patient Active Problem List   Diagnosis Date Noted  . Osteoarthritis of multiple joints 08/05/2014  . PVD (peripheral vascular disease) 08/05/2014  . UTI (urinary tract infection) 08/05/2014  . HTN (hypertension) 11/20/2013  . CVA, old, hemiparesis 08/02/2013  . Chronic suprapubic catheter 08/02/2013  . Personal history of PE (pulmonary embolism) 08/02/2013  . Depression, major, recurrent 08/02/2013  .  Pressure ulcer stage II 08/02/2013  . Anemia of chronic disease 08/02/2013  . Type II or unspecified type diabetes mellitus with ophthalmic manifestations, not stated as uncontrolled(250.50)   . Delusional disorder   . Paraplegia   . 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 Urinary-genital tract fistula, female   . Chronic kidney disease, stage III (moderate)   . Acute appendicitis with peritoneal abscess   . Calculus of lower urinary tract, unspecified   . Sicca syndrome   . Oropharyngeal dysphagia   . Personal history of pulmonary embolism   .  Neurogenic bladder   . DM type 2, controlled, with complication   . ABDOMINAL PAIN -GENERALIZED 05/27/2009  . MULTIPLE SCLEROSIS 04/19/2009  . CVA 04/19/2009  . OTHER FUNCTIONAL DISORDERS OF INTESTINE 04/19/2009    CBC    Component Value Date/Time   WBC 8.2 05/04/2010 1729   RBC 4.79 05/04/2010 1729   HGB 12.6 05/04/2010 1729   HCT 39.3 05/04/2010 1729   PLT 318 05/04/2010 1729   MCV 82.0 05/04/2010 1729   LYMPHSABS 2.4 05/04/2010 1729   MONOABS 0.7 05/04/2010 1729   EOSABS 0.3 05/04/2010 1729   BASOSABS 0.0 05/04/2010 1729    CMP     Component Value Date/Time   NA 137 05/04/2010 1729   K 3.9 05/04/2010 1729   CL 104 05/04/2010 1729   CO2 25 05/04/2010 1729   GLUCOSE 92 05/04/2010 1729   BUN 8 05/04/2010 1729   CREATININE 0.47 05/04/2010 1729   CALCIUM 9.0 05/04/2010 1729   PROT 8.3 05/04/2010 1729   ALBUMIN 3.1* 05/04/2010 1729   AST 28 05/04/2010 1729   ALT 18 05/04/2010 1729   ALKPHOS 69 05/04/2010 1729   BILITOT 0.3 05/04/2010 1729   GFRNONAA >60 05/04/2010 1729   GFRAA  05/04/2010 1729    >60        The eGFR has been calculated using the MDRD equation. This calculation has not been validated in all clinical situations. eGFR's persistently <60 mL/min signify possible Chronic Kidney Disease.    Assessment  and Plan  MULTIPLE SCLEROSIS Has been stable with no exacerbations or declines;sees Dr Jannifer Franklin for exacerbations; pt is now using biofreeze in addition to other meds for pain  Paraplegia 2/2 MS, WC bound  Osteoarthritis of multiple joints Pt is on Vit D and calcium, no fractures/falls since last  Anemia of chronic disease Most recent H/H 12.6/38.1 which is improvement; pt on xarelto  Chronic kidney disease, stage III (moderate) GFR 133,CrCl 115 most recent;pt on ACE   DM type 2, controlled, with complication Y0K 5.7; pt on ACE only  PVD (peripheral vascular disease) Had wound on L great toe, now healed; no other wounds/problems currently  UTI (urinary tract infection) Acute end of Nov with MIC with proteus and kliebsiella;tx with augmentin 875 BID for 7 days with resolution  Depression, major, recurrent Chronic and stable without exacerbation  Personal history of PE (pulmonary embolism) Now on xarelto 15 mg daily as prophylaxis    Hennie Duos, MD

## 2014-08-05 ENCOUNTER — Encounter: Payer: Self-pay | Admitting: Internal Medicine

## 2014-08-05 DIAGNOSIS — E1151 Type 2 diabetes mellitus with diabetic peripheral angiopathy without gangrene: Secondary | ICD-10-CM | POA: Insufficient documentation

## 2014-08-05 DIAGNOSIS — N390 Urinary tract infection, site not specified: Secondary | ICD-10-CM | POA: Insufficient documentation

## 2014-08-05 DIAGNOSIS — M159 Polyosteoarthritis, unspecified: Secondary | ICD-10-CM | POA: Insufficient documentation

## 2014-08-05 NOTE — Assessment & Plan Note (Signed)
Pt is on Vit D and calcium, no fractures/falls since last

## 2014-08-05 NOTE — Assessment & Plan Note (Signed)
A1c 5.7; pt on ACE only

## 2014-08-05 NOTE — Assessment & Plan Note (Signed)
Chronic and stable without exacerbation

## 2014-08-05 NOTE — Assessment & Plan Note (Signed)
Most recent H/H 12.6/38.1 which is improvement; pt on xarelto

## 2014-08-05 NOTE — Assessment & Plan Note (Signed)
Now on xarelto 15 mg daily as prophylaxis

## 2014-08-05 NOTE — Assessment & Plan Note (Signed)
GFR 133,CrCl 115 most recent;pt on ACE

## 2014-08-05 NOTE — Assessment & Plan Note (Signed)
Has been stable with no exacerbations or declines;sees Dr Dynastee Brummell Hahn for exacerbations; pt is now using biofreeze in addition to other meds for pain

## 2014-08-05 NOTE — Assessment & Plan Note (Signed)
Acute end of Nov with MIC with proteus and kliebsiella;tx with augmentin 875 BID for 7 days with resolution

## 2014-08-05 NOTE — Assessment & Plan Note (Signed)
2/2 MS, WC bound

## 2014-08-05 NOTE — Assessment & Plan Note (Signed)
Had wound on L great toe, now healed; no other wounds/problems currently

## 2014-08-15 LAB — BASIC METABOLIC PANEL
BUN: 8 mg/dL (ref 4–21)
Creatinine: 0.5 mg/dL (ref 0.5–1.1)
Glucose: 114 mg/dL
Potassium: 3.6 mmol/L (ref 3.4–5.3)
SODIUM: 138 mmol/L (ref 137–147)

## 2014-08-15 LAB — CBC AND DIFFERENTIAL
HCT: 38 % (ref 36–46)
HEMOGLOBIN: 11.9 g/dL — AB (ref 12.0–16.0)
Platelets: 300 10*3/uL (ref 150–399)
WBC: 6.6 10^3/mL

## 2014-09-12 ENCOUNTER — Other Ambulatory Visit: Payer: Self-pay

## 2014-09-12 MED ORDER — TRAMADOL HCL 50 MG PO TABS: ORAL_TABLET | ORAL | Status: DC

## 2014-09-12 NOTE — Telephone Encounter (Signed)
Faxed to [facility] Fax Number: [phone], Phone Number [phone]  

## 2014-10-01 ENCOUNTER — Non-Acute Institutional Stay (SKILLED_NURSING_FACILITY): Payer: Medicare Other | Admitting: Internal Medicine

## 2014-10-01 DIAGNOSIS — I1310 Hypertensive heart and chronic kidney disease without heart failure, with stage 1 through stage 4 chronic kidney disease, or unspecified chronic kidney disease: Secondary | ICD-10-CM | POA: Diagnosis not present

## 2014-10-01 DIAGNOSIS — G4700 Insomnia, unspecified: Secondary | ICD-10-CM | POA: Diagnosis not present

## 2014-10-01 DIAGNOSIS — N183 Chronic kidney disease, stage 3 unspecified: Secondary | ICD-10-CM

## 2014-10-01 DIAGNOSIS — N319 Neuromuscular dysfunction of bladder, unspecified: Secondary | ICD-10-CM | POA: Diagnosis not present

## 2014-10-01 DIAGNOSIS — M3500 Sicca syndrome, unspecified: Secondary | ICD-10-CM

## 2014-10-01 DIAGNOSIS — F22 Delusional disorders: Secondary | ICD-10-CM | POA: Diagnosis not present

## 2014-10-01 DIAGNOSIS — R1312 Dysphagia, oropharyngeal phase: Secondary | ICD-10-CM

## 2014-10-01 DIAGNOSIS — K315 Obstruction of duodenum: Secondary | ICD-10-CM

## 2014-10-01 DIAGNOSIS — E1151 Type 2 diabetes mellitus with diabetic peripheral angiopathy without gangrene: Secondary | ICD-10-CM | POA: Diagnosis not present

## 2014-10-01 NOTE — Progress Notes (Signed)
MRN: [id number] Name: PRACHI OFTEDAHL  Sex: female Age: 79 y.o. DOB: [birth year]  [facility] Facility/Room:105A Level Of Care: SNF Provider: Inocencio Homes D Emergency Contacts: Extended Emergency Contact Information Primary Emergency Contact: CORBITT,GLADYS Address: [address]          [place]  [place] Home Phone: [phone] Relation: None  Code Status: DNR  Allergies: Strawberry and Sulfonamide derivatives  Chief Complaint  Patient presents with  . Medical Management of Chronic Issues    HPI: Patient is 79 y.o. female who is being seen for routine issues.   Past Medical History  Diagnosis Date  . Type II or unspecified type diabetes mellitus with ophthalmic manifestations, not stated as uncontrolled   . Delusional disorder(297.1)   . Multiple sclerosis   . Paraplegia   . 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 Urinary-genital tract fistula, female   . Other functional disorders of intestine   . Chronic kidney disease, stage III (moderate)   . Acute appendicitis with peritoneal abscess   . Calculus of lower urinary tract, unspecified   . Sicca syndrome   . Dysphagia, unspecified(787.20)   . Personal history of pulmonary embolism   . Neurogenic bladder, NOS   . Type II or unspecified type diabetes mellitus with neurological manifestations, not stated as uncontrolled   . Multiple sclerosis   . Hip fracture   . Tibial fracture     History reviewed. No pertinent past surgical history.    Medication List       This list is accurate as of: 10/01/14 11:59 PM.  Always use your most recent med list.               baclofen 10 MG tablet  Commonly known as:  LIORESAL  Take 10 mg by mouth 3 (three) times daily. An 1 prn between 12a-3a     calcium carbonate 500 MG chewable tablet  Commonly known as:  TUMS - dosed in mg elemental calcium  Chew 1  tablet by mouth daily.     ergocalciferol 50000 UNITS capsule  Commonly known as:  VITAMIN D2  Take 50,000 Units by mouth every 30 (thirty) days.     feeding supplement (PRO-STAT SUGAR FREE 64) Liqd  Take 30 mLs by mouth daily.     fluticasone 50 MCG/ACT nasal spray  Commonly known as:  FLONASE  Place 1 spray into both nostrils 2 (two) times daily.     ipratropium-albuterol 0.5-2.5 (3) MG/3ML Soln  Commonly known as:  DUONEB  Take 3 mLs by nebulization every 6 (six) hours as needed.     lactulose 10 GM/15ML solution  Commonly known as:  CHRONULAC  Take 20 g by mouth every 12 (twelve) hours.     lidocaine 5 %  Commonly known as:  LIDODERM  Place 1 patch onto the skin daily. Remove & Discard patch within 12 hours or as directed by MD     lisinopril 2.5 MG tablet  Commonly known as:  PRINIVIL,ZESTRIL  Take 5 mg by mouth daily.     loratadine-pseudoephedrine 10-240 MG per 24 hr tablet  Commonly known as:  CLARITIN-D 24-hour  Take 1 tablet by mouth daily.     metoCLOPramide 10 MG tablet  Commonly known as:  REGLAN  Take 10 mg by mouth 4 (four) times daily.     multivitamin with minerals tablet  Take 1  tablet by mouth daily.     OXYGEN  Inhale into the lungs.     promethazine 25 MG/ML injection  Commonly known as:  PHENERGAN  Inject 25 mg into the muscle every 6 (six) hours as needed for nausea or vomiting. Or po     QUEtiapine 25 MG tablet  Commonly known as:  SEROQUEL  Take 12.5 mg by mouth at bedtime.     sennosides-docusate sodium 8.6-50 MG tablet  Commonly known as:  SENOKOT-S  Take 1 tablet by mouth at bedtime.     traMADol 50 MG tablet  Commonly known as:  ULTRAM  Take two tablets by mouth at bedtime. Hold for excessive sedation     UTI-STAT Liqd  Take 30 mLs by mouth daily.     venlafaxine 75 MG tablet  Commonly known as:  EFFEXOR  Take 75 mg by mouth daily.     zolpidem 5 MG tablet  Commonly known as:  AMBIEN  Take one tablet by mouth at bedtime  for rest        No orders of the defined types were placed in this encounter.    Immunization History  Administered Date(s) Administered  . Influenza-Unspecified 04/26/2013  . Pneumococcal-Unspecified 06/07/2008    History  Substance Use Topics  . Smoking status: Never Smoker   . Smokeless tobacco: Never Used  . Alcohol Use: No     Comment: prior use    Review of Systems  DATA OBTAINED: from patient; sleep issues GENERAL:  no fevers, fatigue, appetite changes SKIN: No itching, rash HEENT: No complaint RESPIRATORY: No cough, wheezing, SOB CARDIAC: No chest pain, palpitations, lower extremity edema  GI: No abdominal pain, No N/V/D; chronic constipation, No heartburn or reflux  GU: No dysuria, frequency or urgency, or incontinence  MUSCULOSKELETAL: No unrelieved bone/joint pain NEUROLOGIC: No headache, dizziness  PSYCHIATRIC: No overt anxiety or sadness  Filed Vitals:   10/01/14 2033  BP: 146/69  Pulse: 72  Temp: 99.3 F (37.4 C)  Resp: 18    Physical Exam  GENERAL APPEARANCE: Alert, conversant, No acute distress  SKIN: No diaphoresis rash, or wounds HEENT: Unremarkable RESPIRATORY: Breathing is even, unlabored. Lung sounds are clear   CARDIOVASCULAR: Heart RRR no murmurs, rubs or gallops. No peripheral edema  GASTROINTESTINAL: Abdomen is soft, non-tender, not distended w/ normal bowel sounds.  GENITOURINARY: Bladder non tender, not distended  MUSCULOSKELETAL: contractures upper and LE NEUROLOGIC: Cranial nerves 2-12 grossly intact; PSYCHIATRIC: Mood and affect appropriate to situation, no behavioral issues  Patient Active Problem List   Diagnosis Date Noted  . Insomnia 10/07/2014  . Osteoarthritis of multiple joints 08/05/2014  . Type 2 diabetes mellitus with peripheral angiopathy 08/05/2014  . UTI (urinary tract infection) 08/05/2014  . HTN (hypertension) 11/20/2013  . CVA, old, hemiparesis 08/02/2013  . Chronic suprapubic catheter 08/02/2013  .  Personal history of PE (pulmonary embolism) 08/02/2013  . Depression, major, recurrent 08/02/2013  . Pressure ulcer stage II 08/02/2013  . Anemia of chronic disease 08/02/2013  . Type II or unspecified type diabetes mellitus with ophthalmic manifestations, not stated as uncontrolled(250.50)   . Delusional disorder   . Paraplegia   . Hypertensive heart and kidney disease without heart failure and with chronic kidney disease stage III   . Urinary-genital tract fistula, female   . Chronic kidney disease, stage III (moderate)   . Acute appendicitis with peritoneal abscess   . Calculus of lower urinary tract, unspecified   . Sicca syndrome   .  Oropharyngeal dysphagia   . Personal history of pulmonary embolism   . Neurogenic bladder   . DM type 2, controlled, with complication   . ABDOMINAL PAIN -GENERALIZED 05/27/2009  . MULTIPLE SCLEROSIS 04/19/2009  . CVA 04/19/2009  . Duodenal ileus, chronic 04/19/2009    CBC    Component Value Date/Time   WBC 8.2 05/04/2010 1729   RBC 4.79 05/04/2010 1729   HGB 12.6 05/04/2010 1729   HCT 39.3 05/04/2010 1729   PLT 318 05/04/2010 1729   MCV 82.0 05/04/2010 1729   LYMPHSABS 2.4 05/04/2010 1729   MONOABS 0.7 05/04/2010 1729   EOSABS 0.3 05/04/2010 1729   BASOSABS 0.0 05/04/2010 1729    CMP     Component Value Date/Time   NA 137 05/04/2010 1729   K 3.9 05/04/2010 1729   CL 104 05/04/2010 1729   CO2 25 05/04/2010 1729   GLUCOSE 92 05/04/2010 1729   BUN 8 05/04/2010 1729   CREATININE 0.47 05/04/2010 1729   CALCIUM 9.0 05/04/2010 1729   PROT 8.3 05/04/2010 1729   ALBUMIN 3.1* 05/04/2010 1729   AST 28 05/04/2010 1729   ALT 18 05/04/2010 1729   ALKPHOS 69 05/04/2010 1729   BILITOT 0.3 05/04/2010 1729   GFRNONAA >60 05/04/2010 1729   GFRAA  05/04/2010 1729    >60        The eGFR has been calculated using the MDRD equation. This calculation has not been validated in all clinical situations. eGFR's persistently <60 mL/min  signify possible Chronic Kidney Disease.    Assessment and Plan  Hypertensive heart and kidney disease without heart failure and with chronic kidney disease stage III Reasonably controlled on lisinopril;Plan-continue lisinopril 5 mg daily   Type 2 diabetes mellitus with peripheral angiopathy No reports of new wounds, L great toe remains healed; Plan- bp control, xarelto   Oropharyngeal dysphagia Chronic and stable without any chocking or aspiration sx;currently on regular diet with 50-75% intake;Plan - cont reg diet and cont monitor   Duodenal ileus, chronic 2/2 to MS chronic and stable with chronic constipation and distended abd;Plan - continue lactulose BID, reglan , and Armitzia for sx control   Sicca syndrome Chronic and stablw without progression;Plan - cont refresh eye drops QID   Delusional disorder Chronic and stable with no current delusions;pt does get more confused with UTIs;Plan - continue seroquel 12.5 mg qHS   Insomnia Chronic and stable with inability to sleep at night a daytime sleepiness;trying to get on day schedule;Plan -continue ambien 5 mg nightly     Hennie Duos, MD

## 2014-10-07 ENCOUNTER — Encounter: Payer: Self-pay | Admitting: Internal Medicine

## 2014-10-07 DIAGNOSIS — G4700 Insomnia, unspecified: Secondary | ICD-10-CM | POA: Insufficient documentation

## 2014-10-07 NOTE — Assessment & Plan Note (Signed)
2/2 MS;chroniv and ongoing with suprapubic catheter;Plan - continue oxybutinin BID, UTI stat daily;[facility] f/u prn

## 2014-10-07 NOTE — Assessment & Plan Note (Signed)
No reports of new wounds, L great toe remains healed; Plan- bp control, xarelto

## 2014-10-07 NOTE — Assessment & Plan Note (Signed)
Chronic and stable with inability to sleep at night a daytime sleepiness;trying to get on day schedule;Plan -continue ambien 5 mg nightly

## 2014-10-07 NOTE — Assessment & Plan Note (Signed)
Reasonably controlled on lisinopril;Plan-continue lisinopril 5 mg daily

## 2014-10-07 NOTE — Assessment & Plan Note (Signed)
2/2 to MS chronic and stable with chronic constipation and distended abd;Plan - continue lactulose BID, reglan , and Armitzia for sx control

## 2014-10-07 NOTE — Assessment & Plan Note (Signed)
Chronic and stablw without progression;Plan - cont refresh eye drops QID

## 2014-10-07 NOTE — Assessment & Plan Note (Signed)
Chronic and stable without any chocking or aspiration sx;currently on regular diet with 50-75% intake;Plan - cont reg diet and cont monitor

## 2014-10-07 NOTE — Assessment & Plan Note (Signed)
Chronic and stable with no current delusions;pt does get more confused with UTIs;Plan - continue seroquel 12.5 mg qHS

## 2014-10-25 ENCOUNTER — Other Ambulatory Visit: Payer: Self-pay | Admitting: *Deleted

## 2014-10-25 MED ORDER — ZOLPIDEM TARTRATE 5 MG PO TABS: ORAL_TABLET | ORAL | Status: DC

## 2014-10-25 NOTE — Telephone Encounter (Signed)
[facility] 

## 2014-12-06 ENCOUNTER — Other Ambulatory Visit: Payer: Self-pay | Admitting: *Deleted

## 2014-12-06 MED ORDER — ZOLPIDEM TARTRATE 5 MG PO TABS
ORAL_TABLET | ORAL | Status: DC
Start: 2014-12-06 — End: 2015-10-31

## 2014-12-06 NOTE — Telephone Encounter (Signed)
[facility] 

## 2015-02-17 ENCOUNTER — Encounter: Payer: Self-pay | Admitting: Internal Medicine

## 2015-02-17 ENCOUNTER — Non-Acute Institutional Stay (SKILLED_NURSING_FACILITY): Payer: Medicare Other | Admitting: Internal Medicine

## 2015-02-17 DIAGNOSIS — F330 Major depressive disorder, recurrent, mild: Secondary | ICD-10-CM

## 2015-02-17 DIAGNOSIS — F22 Delusional disorders: Secondary | ICD-10-CM

## 2015-02-17 DIAGNOSIS — G4700 Insomnia, unspecified: Secondary | ICD-10-CM | POA: Diagnosis not present

## 2015-02-17 NOTE — Assessment & Plan Note (Signed)
Has been controlled on ambien 5 mg but due to insurance non coverage meds have been changed;Plan - start trazodone 50 mg qHs and monitor for effectiveness

## 2015-02-17 NOTE — Assessment & Plan Note (Signed)
Chronic and stable;Plan - cont Effexor 75 mg daily

## 2015-02-17 NOTE — Progress Notes (Signed)
MRN: [id number] Name: [name]  Sex: female Age: 79 y.o. DOB: 11-16-1935  [facility] #: [facility] Facility/Room:105 Level Of Care: SNF Provider: Merrilee Seashore D Emergency Contacts: Extended Emergency Contact Information Primary Emergency Contact: CORBITT,GLADYS Address: [address]          [place]  [address] Home Phone: [phone] Relation: None  Code Status:DNR   Allergies: Strawberry and Sulfonamide derivatives  Chief Complaint  Patient presents with  . Medical Management of Chronic Issues    HPI: Patient is 79 y.o. female who is being seen for insomnia, delusional disorder and depression.  Past Medical History  Diagnosis Date  . Type II or unspecified type diabetes mellitus with ophthalmic manifestations, not stated as uncontrolled   . Delusional disorder(297.1)   . Multiple sclerosis   . Paraplegia   . 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 Urinary-genital tract fistula, female   . Other functional disorders of intestine   . Chronic kidney disease, stage III (moderate)   . Acute appendicitis with peritoneal abscess   . Calculus of lower urinary tract, unspecified   . Sicca syndrome   . Dysphagia, unspecified(787.20)   . Personal history of pulmonary embolism   . Neurogenic bladder, NOS   . Type II or unspecified type diabetes mellitus with neurological manifestations, not stated as uncontrolled   . Multiple sclerosis   . Hip fracture   . Tibial fracture     History reviewed. No pertinent past surgical history.    Medication List       This list is accurate as of: 02/17/15  4:49 PM.  Always use your most recent med list.               baclofen 10 MG tablet  Commonly known as:  LIORESAL  Take 10 mg by mouth 3 (three) times daily. An 1 prn between 12a-3a     calcium carbonate 500 MG chewable tablet  Commonly known as:  TUMS - dosed in  mg elemental calcium  Chew 1 tablet by mouth daily.     ergocalciferol 50000 UNITS capsule  Commonly known as:  VITAMIN D2  Take 50,000 Units by mouth every 30 (thirty) days.     feeding supplement (PRO-STAT SUGAR FREE 64) Liqd  Take 30 mLs by mouth daily.     fluticasone 50 MCG/ACT nasal spray  Commonly known as:  FLONASE  Place 1 spray into both nostrils 2 (two) times daily.     ipratropium-albuterol 0.5-2.5 (3) MG/3ML Soln  Commonly known as:  DUONEB  Take 3 mLs by nebulization every 6 (six) hours as needed.     lactulose 10 GM/15ML solution  Commonly known as:  CHRONULAC  Take 20 g by mouth every 12 (twelve) hours.     lidocaine 5 %  Commonly known as:  LIDODERM  Place 1 patch onto the skin daily. Remove & Discard patch within 12 hours or as directed by MD     lisinopril 2.5 MG tablet  Commonly known as:  PRINIVIL,ZESTRIL  Take 5 mg by mouth daily.     loratadine-pseudoephedrine 10-240 MG per 24 hr tablet  Commonly known as:  CLARITIN-D 24-hour  Take 1 tablet by mouth daily.     metoCLOPramide 10 MG tablet  Commonly known as:  REGLAN  Take 10 mg by mouth 4 (four) times daily.     multivitamin with minerals tablet  Take 1 tablet by mouth daily.     OXYGEN  Inhale into the lungs.     promethazine 25 MG/ML injection  Commonly known as:  PHENERGAN  Inject 25 mg into the muscle every 6 (six) hours as needed for nausea or vomiting. Or po     QUEtiapine 25 MG tablet  Commonly known as:  SEROQUEL  Take 12.5 mg by mouth at bedtime.     sennosides-docusate sodium 8.6-50 MG tablet  Commonly known as:  SENOKOT-S  Take 1 tablet by mouth at bedtime.     traMADol 50 MG tablet  Commonly known as:  ULTRAM  Take two tablets by mouth at bedtime. Hold for excessive sedation     UTI-STAT Liqd  Take 30 mLs by mouth daily.     venlafaxine 75 MG tablet  Commonly known as:  EFFEXOR  Take 75 mg by mouth daily.     zolpidem 5 MG tablet  Commonly known as:  AMBIEN  Take  one tablet by mouth at bedtime for rest        No orders of the defined types were placed in this encounter.    Immunization History  Administered Date(s) Administered  . Influenza-Unspecified 04/26/2013  . Pneumococcal-Unspecified 06/07/2008    History  Substance Use Topics  . Smoking status: Never Smoker   . Smokeless tobacco: Never Used  . Alcohol Use: No     Comment: prior use    Review of Systems  DATA OBTAINED: from patient, nurse GENERAL:  no fevers, fatigue, appetite changes SKIN: No itching, rash HEENT: No complaint RESPIRATORY: No cough, wheezing, SOB CARDIAC: No chest pain, palpitations, lower extremity edema  GI: No abdominal pain, No N/V/D or constipation, No heartburn or reflux  GU: No dysuria, frequency or urgency, or incontinence  MUSCULOSKELETAL: No unrelieved bone/joint pain NEUROLOGIC: No headache, dizziness  PSYCHIATRIC: No overt anxiety or sadness  Filed Vitals:   02/17/15 1634  BP: 135/79  Pulse: 68  Temp: 97.2 F (36.2 C)  Resp: 16    Physical Exam  GENERAL APPEARANCE: Alert, BF, No acute distress ,sitting in gerichair SKIN: No diaphoresis or rash HEENT: HEENT, PERRL RESPIRATORY: Breathing is even, unlabored, chest rise symmetrical  CARDIOVASCULAR:  No peripheral edema , no cyanosis, porto-cath L chest GASTROINTESTINAL: Abdomen is not distended   GENITOURINARY: Bladder not distended, foley cath  MUSCULOSKELETAL: wasting and contractures BUE,BLE NEUROLOGIC: Cranial nerves 2-12 grossly intact PSYCHIATRIC: Mood and affect appropriate to situation, no behavioral issues  Patient Active Problem List   Diagnosis Date Noted  . Insomnia 10/07/2014  . Osteoarthritis of multiple joints 08/05/2014  . Type 2 diabetes mellitus with peripheral angiopathy 08/05/2014  . UTI (urinary tract infection) 08/05/2014  . HTN (hypertension) 11/20/2013  . CVA, old, hemiparesis 08/02/2013  . Chronic suprapubic catheter 08/02/2013  . Personal history of PE  (pulmonary embolism) 08/02/2013  . Depression, major, recurrent 08/02/2013  . Pressure ulcer stage II 08/02/2013  . Anemia of chronic disease 08/02/2013  . Type II or unspecified type diabetes mellitus with ophthalmic manifestations, not stated as uncontrolled   . Delusional disorder   . Paraplegia   . Hypertensive heart and kidney disease without heart failure and with chronic kidney disease stage III   . Urinary-genital tract fistula, female   . Chronic kidney disease, stage III (moderate)   . Acute appendicitis with peritoneal abscess   . Calculus of lower urinary tract, unspecified   . Sicca syndrome   . Oropharyngeal dysphagia   .  Personal history of pulmonary embolism   . Neurogenic bladder   . DM type 2, controlled, with complication   . ABDOMINAL PAIN -GENERALIZED 05/27/2009  . MULTIPLE SCLEROSIS 04/19/2009  . CVA 04/19/2009  . Duodenal ileus, chronic 04/19/2009       Assessment and Plan  Insomnia Has been controlled on ambien 5 mg but due to insurance non coverage meds have been changed;Plan - start trazodone 50 mg qHs and monitor for effectiveness  Depression, major, recurrent Chronic and stable;Plan - cont Effexor 75 mg daily  Delusional disorder No reported delusions or disturbances; Plan - cont seroquel 12.5 mg daily    Margit Hanks, MD

## 2015-02-17 NOTE — Assessment & Plan Note (Signed)
No reported delusions or disturbances; Plan - cont seroquel 12.5 mg daily

## 2015-04-04 ENCOUNTER — Other Ambulatory Visit: Payer: Self-pay | Admitting: *Deleted

## 2015-04-04 MED ORDER — TRAMADOL HCL 50 MG PO TABS: ORAL_TABLET | ORAL | Status: AC

## 2015-04-04 NOTE — Telephone Encounter (Signed)
[facility] 

## 2015-06-17 ENCOUNTER — Encounter: Payer: Self-pay | Admitting: Internal Medicine

## 2015-06-17 ENCOUNTER — Non-Acute Institutional Stay (SKILLED_NURSING_FACILITY): Payer: Medicare Other | Admitting: Internal Medicine

## 2015-06-17 DIAGNOSIS — K315 Obstruction of duodenum: Secondary | ICD-10-CM

## 2015-06-17 DIAGNOSIS — E1151 Type 2 diabetes mellitus with diabetic peripheral angiopathy without gangrene: Secondary | ICD-10-CM | POA: Diagnosis not present

## 2015-06-17 DIAGNOSIS — G8220 Paraplegia, unspecified: Secondary | ICD-10-CM | POA: Diagnosis not present

## 2015-06-17 DIAGNOSIS — F330 Major depressive disorder, recurrent, mild: Secondary | ICD-10-CM | POA: Diagnosis not present

## 2015-06-17 DIAGNOSIS — E860 Dehydration: Secondary | ICD-10-CM

## 2015-06-17 DIAGNOSIS — M150 Primary generalized (osteo)arthritis: Secondary | ICD-10-CM

## 2015-06-17 DIAGNOSIS — G35 Multiple sclerosis: Secondary | ICD-10-CM | POA: Diagnosis not present

## 2015-06-17 DIAGNOSIS — N319 Neuromuscular dysfunction of bladder, unspecified: Secondary | ICD-10-CM | POA: Diagnosis not present

## 2015-06-17 DIAGNOSIS — M159 Polyosteoarthritis, unspecified: Secondary | ICD-10-CM

## 2015-06-17 NOTE — Progress Notes (Signed)
Patient ID: [name], female   DOB: 03/30/1936, 79 y.o.   MRN: [id number]    DATE: 06/17/15  Location:  [facility]    Place of Service: SNF (31)   Extended Emergency Contact Information Primary Emergency Contact: CORBITT,GLADYS Address: [place]          [place]  [place] Home Phone: [phone] Relation: None  Advanced Directive information  DNR  Chief Complaint  Patient presents with  . Medical Management of Chronic Issues    HPI:  79 yo female long term care resident seen today for f/u. Nursing c/a volume status. Staff was unable to get IV restarted over the weekend after she pulled it out. Nursing would like to start cysis instead. She has a port-a-cath but only RNs can access it. Pt c/o generalized pain today. she is on IV rocephin. CBGs are not checked, she is a poor historian due to psychiatric d/o. Hx obtained from chart and nursing  MS/paraplegia/neurogenic bladder/FTT/OA - she has a foley cath due to neurogenic bladder. She takes tramadol for pain as well as lidocaine patch, baclofen for spasms. She uses duoneb prn. Takes reglan with meals and prn phenergan. constipation stable on lactulose and senokot. She takes nutritional supplement  MDD/insomnia/delusional d/o - mood stable on seroquel and effexor. ambien helps sleep  CKD - stable Cr  DM  - diet controlled  HTN - BP stable on lisinopril  Allergic rhinitis - stable on claritin D and flonase   Past Medical History  Diagnosis Date  . Type II or unspecified type diabetes mellitus with ophthalmic manifestations, not stated as uncontrolled   . Delusional disorder(297.1)   . Multiple sclerosis ([place])   . Paraplegia ([place])   . 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 Urinary-genital tract fistula, female   . Other functional disorders of intestine   . Chronic kidney disease,  stage III (moderate)   . Acute appendicitis with peritoneal abscess   . Calculus of lower urinary tract, unspecified   . Sicca syndrome ([place])   . Dysphagia, unspecified(787.20)   . Personal history of pulmonary embolism   . Neurogenic bladder, NOS   . Type II or unspecified type diabetes mellitus with neurological manifestations, not stated as uncontrolled   . Multiple sclerosis ([place])   . Hip fracture ([place])   . Tibial fracture     No past surgical history on file.  No care team member to display  Social History   Social History  . Marital Status: Divorced    Spouse Name: N/A  . Number of Children: N/A  . Years of Education: college gr   Occupational History  . teacher    Social History Main Topics  . Smoking status: Never Smoker   . Smokeless tobacco: Never Used  . Alcohol Use: No     Comment: prior use  . Drug Use: No  . Sexual Activity: Not on file   Other Topics Concern  . Not on file   Social History Narrative     reports that she has never smoked. She has never used smokeless tobacco. She reports that she does not drink alcohol or use illicit drugs.  Immunization History  Administered Date(s) Administered  . Influenza-Unspecified 04/26/2013  . Pneumococcal-Unspecified 06/07/2008    Allergies  Allergen Reactions  . Strawberry Extract   . Sulfonamide Derivatives     Medications:  Patient's Medications  New Prescriptions   No medications on file  Previous Medications   AMINO ACIDS-PROTEIN HYDROLYS (FEEDING SUPPLEMENT, PRO-STAT SUGAR FREE 64,) LIQD    Take 30 mLs by mouth daily.   BACLOFEN (LIORESAL) 10 MG TABLET    Take 10 mg by mouth 3 (three) times daily. An 1 prn between 12a-3a   CALCIUM CARBONATE (TUMS - DOSED IN MG ELEMENTAL CALCIUM) 500 MG CHEWABLE TABLET    Chew 1 tablet by mouth daily.   CRANBERRY-VITAMIN C-INULIN (UTI-STAT) LIQD    Take 30 mLs by mouth daily.   ERGOCALCIFEROL (VITAMIN D2) 50000 UNITS CAPSULE    Take 50,000 Units by mouth  every 30 (thirty) days.   FLUTICASONE (FLONASE) 50 MCG/ACT NASAL SPRAY    Place 1 spray into both nostrils 2 (two) times daily.   IPRATROPIUM-ALBUTEROL (DUONEB) 0.5-2.5 (3) MG/3ML SOLN    Take 3 mLs by nebulization every 6 (six) hours as needed.   LACTULOSE (CHRONULAC) 10 GM/15ML SOLUTION    Take 20 g by mouth every 12 (twelve) hours.   LIDOCAINE (LIDODERM) 5 %    Place 1 patch onto the skin daily. Remove & Discard patch within 12 hours or as directed by MD   LISINOPRIL (PRINIVIL,ZESTRIL) 2.5 MG TABLET    Take 5 mg by mouth daily.    LORATADINE-PSEUDOEPHEDRINE (CLARITIN-D 24-HOUR) 10-240 MG PER 24 HR TABLET    Take 1 tablet by mouth daily.   METOCLOPRAMIDE (REGLAN) 10 MG TABLET    Take 10 mg by mouth 4 (four) times daily.   MULTIPLE VITAMINS-MINERALS (MULTIVITAMIN WITH MINERALS) TABLET    Take 1 tablet by mouth daily.   OXYGEN-HELIUM IN    Inhale into the lungs.   PROMETHAZINE (PHENERGAN) 25 MG/ML INJECTION    Inject 25 mg into the muscle every 6 (six) hours as needed for nausea or vomiting. Or po   QUETIAPINE (SEROQUEL) 25 MG TABLET    Take 12.5 mg by mouth at bedtime.   SENNOSIDES-DOCUSATE SODIUM (SENOKOT-S) 8.6-50 MG TABLET    Take 1 tablet by mouth at bedtime.   TRAMADOL (ULTRAM) 50 MG TABLET    Take two tablets by mouth at bedtime for pain. Hold for excessive sedation   VENLAFAXINE (EFFEXOR) 75 MG TABLET    Take 75 mg by mouth daily.   ZOLPIDEM (AMBIEN) 5 MG TABLET    Take one tablet by mouth at bedtime for rest  Modified Medications   No medications on file  Discontinued Medications   No medications on file    Review of Systems  Unable to perform ROS: Psychiatric disorder    Filed Vitals:   06/17/15 1545  BP: 149/92  Pulse: 96  Temp: 100.1 F (37.8 C)  Weight: 171 lb 6.4 oz (77.747 kg)  SpO2: 99%   There is no height on file to calculate BMI.  Physical Exam  Constitutional: She appears well-developed.  Frail appearing in NAD, lying in bed  HENT:  Mouth/Throat:  Oropharynx is clear and moist. No oropharyngeal exudate.  MM dry  Eyes: Pupils are equal, round, and reactive to light. No scleral icterus.  Neck: Neck supple. Carotid bruit is not present. No tracheal deviation present.  Cardiovascular: Normal rate and intact distal pulses.  An irregularly irregular rhythm present. Exam reveals no gallop and no friction rub.   Murmur heard.  Systolic murmur is present with a grade of 1/6  No LE edema b/l. Unna boots intact b/l  Pulmonary/Chest: Effort normal and breath sounds normal. No  stridor. No respiratory distress. She has no wheezes. She has no rales.  Abdominal: Soft. Bowel sounds are normal. She exhibits distension. She exhibits no mass. There is no hepatomegaly. There is generalized tenderness. There is no rebound and no guarding.  Genitourinary:  Suprapubic foley cath DTG dark urine  Musculoskeletal: She exhibits edema.  Extremity contractures b/l  Lymphadenopathy:    She has no cervical adenopathy.  Neurological: She is alert.  Skin: Skin is warm and dry. No rash noted.  Psychiatric: She has a normal mood and affect. Her behavior is normal.     Labs reviewed: No visits with results within 3 Month(s) from this visit. Latest known visit with results is:  Hospital Outpatient Visit on 05/04/2010  Component Date Value Ref Range Status  . Fecal Occult Bld 05/04/2010 NEGATIVE   Final  . WBC 05/04/2010 8.2  4.0 - 10.5 K/uL Final  . RBC 05/04/2010 4.79  3.87 - 5.11 MIL/uL Final  . Hemoglobin 05/04/2010 12.6  12.0 - 15.0 g/dL Final  . HCT 05/04/2010 39.3  36.0 - 46.0 % Final  . MCV 05/04/2010 82.0  78.0 - 100.0 fL Final  . MCH 05/04/2010 26.3  26.0 - 34.0 pg Final  . MCHC 05/04/2010 32.1  30.0 - 36.0 g/dL Final  . RDW 05/04/2010 15.1  11.5 - 15.5 % Final  . Platelets 05/04/2010 318  150 - 400 K/uL Final  . Sodium 05/04/2010 137  135 - 145 mEq/L Final  . Potassium 05/04/2010 3.9  3.5 - 5.1 mEq/L Final  . Chloride 05/04/2010 104  96 - 112  mEq/L Final  . CO2 05/04/2010 25  19 - 32 mEq/L Final  . Glucose, Bld 05/04/2010 92  70 - 99 mg/dL Final  . BUN 05/04/2010 8  6 - 23 mg/dL Final  . Creatinine, Ser 05/04/2010 0.47  0.4 - 1.2 mg/dL Final  . Calcium 05/04/2010 9.0  8.4 - 10.5 mg/dL Final  . Total Protein 05/04/2010 8.3  6.0 - 8.3 g/dL Final  . Albumin 05/04/2010 3.1* 3.5 - 5.2 g/dL Final  . AST 05/04/2010 28  0 - 37 U/L Final  . ALT 05/04/2010 18  0 - 35 U/L Final  . Alkaline Phosphatase 05/04/2010 69  39 - 117 U/L Final  . Total Bilirubin 05/04/2010 0.3  0.3 - 1.2 mg/dL Final  . GFR calc non Af Amer 05/04/2010 >60  >60 mL/min Final  . GFR calc Af Amer 05/04/2010   >60 mL/min Final                   Value:>60                                The eGFR has been calculated                         using the MDRD equation.                         This calculation has not been                         validated in all clinical                         situations.  eGFR's persistently                         <60 mL/min signify                         possible Chronic Kidney Disease.  Marland Kitchen Neutrophils Relative % 05/04/2010 57  43 - 77 % Final  . Neutro Abs 05/04/2010 4.7  1.7 - 7.7 K/uL Final  . Lymphocytes Relative 05/04/2010 30  12 - 46 % Final  . Lymphs Abs 05/04/2010 2.4  0.7 - 4.0 K/uL Final  . Monocytes Relative 05/04/2010 9  3 - 12 % Final  . Monocytes Absolute 05/04/2010 0.7  0.1 - 1.0 K/uL Final  . Eosinophils Relative 05/04/2010 4  0 - 5 % Final  . Eosinophils Absolute 05/04/2010 0.3  0.0 - 0.7 K/uL Final  . Basophils Relative 05/04/2010 1  0 - 1 % Final  . Basophils Absolute 05/04/2010 0.0  0.0 - 0.1 K/uL Final  . Prothrombin Time 05/04/2010 19.1* 11.6 - 15.2 seconds Final  . INR 05/04/2010 1.59* 0.00 - 1.49 Final  . Glucose-Capillary 05/04/2010 90  70 - 99 mg/dL Final  . Color, Urine 05/04/2010 YELLOW  YELLOW Final  . APPearance 05/04/2010 CLOUDY* CLEAR Final  . Specific Gravity, Urine  05/04/2010 1.011  1.005 - 1.030 Final  . pH 05/04/2010 6.5  5.0 - 8.0 Final  . Glucose, UA 05/04/2010 NEGATIVE  NEGATIVE mg/dL Final  . Hgb urine dipstick 05/04/2010 MODERATE* NEGATIVE Final  . Bilirubin Urine 05/04/2010 NEGATIVE  NEGATIVE Final  . Ketones, ur 05/04/2010 NEGATIVE  NEGATIVE mg/dL Final  . Protein, ur 05/04/2010 NEGATIVE  NEGATIVE mg/dL Final  . Urobilinogen, UA 05/04/2010 0.2  0.0 - 1.0 mg/dL Final  . Nitrite 05/04/2010 NEGATIVE  NEGATIVE Final  . Leukocytes, UA 05/04/2010 LARGE* NEGATIVE Final  . Squamous Epithelial / LPF 05/04/2010 FEW* RARE Final  . WBC, UA 05/04/2010 TOO NUMEROUS TO COUNT  <3 WBC/hpf Final  . Urine-Other 05/04/2010 MANY YEAST   Final  . Specimen Description 05/04/2010 URINE, CLEAN CATCH   Final  . Special Requests 05/04/2010 NONE   Final  . Culture  Setup Time 05/04/2010 [id number]   Final  . Colony Count 05/04/2010 >=100,000 COLONIES/ML   Final  . Culture 05/04/2010 Multiple bacterial morphotypes present, none predominant. Suggest appropriate recollection if clinically indicated.   Final  . Report Status 05/04/2010 05/06/2010 FINAL   Final    No results found.   Assessment/Plan   ICD-9-CM ICD-10-CM   1. Dehydration 276.51 E86.0   2. Multiple sclerosis ([place]) 340 G35   3. Paraplegia ([facility]) 344.1 G82.20   4. Type 2 diabetes mellitus with peripheral angiopathy (H[facility]) 250.70 E11.51    443.81    5. Major depressive disorder, recurrent episode, mild ([facility]) 296.31 F33.0   6. Duodenal ileus, chronic 537.2 K31.5   7. Primary osteoarthritis involving multiple joints 715.09 M15.0   8. Neurogenic bladder 596.54 N31.9     S/w [facility] NP Brandy. Pt needs fluid rescucitation with IVF. bedside PICC line ordered to be done by mobile unit  Cont abx rocephin  Cont other meds as ordered  Nutritional supplement per facility protocol  S/w family, Regino Schultze, at bedside and she is in agreement with plan  Will follow  Lacreasha Hinds S. Perlie Gold    [facility] [address] [place], [place] [address] [phone] Cell (  Monday-Friday 8 AM - 5 PM) [phone] After 5 PM and follow prompts

## 2015-08-19 ENCOUNTER — Non-Acute Institutional Stay (SKILLED_NURSING_FACILITY): Payer: Medicare Other | Admitting: Internal Medicine

## 2015-08-19 ENCOUNTER — Encounter: Payer: Self-pay | Admitting: Internal Medicine

## 2015-08-19 DIAGNOSIS — K315 Obstruction of duodenum: Secondary | ICD-10-CM

## 2015-08-19 DIAGNOSIS — F330 Major depressive disorder, recurrent, mild: Secondary | ICD-10-CM

## 2015-08-19 DIAGNOSIS — N319 Neuromuscular dysfunction of bladder, unspecified: Secondary | ICD-10-CM

## 2015-08-19 DIAGNOSIS — E1151 Type 2 diabetes mellitus with diabetic peripheral angiopathy without gangrene: Secondary | ICD-10-CM

## 2015-08-19 DIAGNOSIS — F22 Delusional disorders: Secondary | ICD-10-CM

## 2015-08-19 DIAGNOSIS — M159 Polyosteoarthritis, unspecified: Secondary | ICD-10-CM

## 2015-08-19 DIAGNOSIS — I69359 Hemiplegia and hemiparesis following cerebral infarction affecting unspecified side: Secondary | ICD-10-CM | POA: Diagnosis not present

## 2015-08-19 DIAGNOSIS — M150 Primary generalized (osteo)arthritis: Secondary | ICD-10-CM

## 2015-08-19 DIAGNOSIS — G35 Multiple sclerosis: Secondary | ICD-10-CM

## 2015-08-19 DIAGNOSIS — G8220 Paraplegia, unspecified: Secondary | ICD-10-CM | POA: Diagnosis not present

## 2015-08-19 NOTE — Progress Notes (Signed)
Patient ID: [name], female   DOB: 1935/12/17, 80 y.o.   MRN: [id number]    DATE: 08/19/15  Location:  [facility]    Place of Service: SNF (31)   Extended Emergency Contact Information Primary Emergency Contact: CORBITT,GLADYS Address: [address]          [place]  [address] Home Phone: [phone] Relation: None  Advanced Directive information  DNR  Chief Complaint  Patient presents with  . Medical Management of Chronic Issues    [facility]    HPI:  80 yo female long term resident seen today for f/u. She has no c/o. No nursing issues. No falls. Appetite is okay. She sleeps well most nights. She is a poor historian due to psych d/o. Hx obtained form chart.  MS/paraplegia/neurogenic bladder - spasm stable on baclofen. pain stable on tramadol and lidoderm patch. She takes lactulose and senokot for constipation. reglan for chronic ileusand  phenergan prn N/V. She uses duonebs prn  Seasonal allergy - She takes claritin d as needed and  flonase  MDD/insomnia/delusional d/o  - stable on effexor, ambien, seroquel  CKD - resolved with last Cr 0.5  DM - diet controlled  HTN - BP stable on lisinopril  Past Medical History  Diagnosis Date  . Type II or unspecified type diabetes mellitus with ophthalmic manifestations, not stated as uncontrolled   . Delusional disorder(297.1)   . Multiple sclerosis ([facility])   . Paraplegia (H[facility])   . 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 Urinary-genital tract fistula, female   . Other functional disorders of intestine   . Chronic kidney disease, stage III (moderate)   . Acute appendicitis with peritoneal abscess   . Calculus of lower urinary tract, unspecified   . Sicca syndrome ([facility])   . Dysphagia, unspecified(787.20)   . Personal history of pulmonary embolism   . Neurogenic bladder, NOS   . Type II or unspecified  type diabetes mellitus with neurological manifestations, not stated as uncontrolled   . Multiple sclerosis ([facility])   . Hip fracture ([facility])   . Tibial fracture     No past surgical history on file.  No care team member to display  Social History   Social History  . Marital Status: Divorced    Spouse Name: N/A  . Number of Children: N/A  . Years of Education: college gr   Occupational History  . teacher    Social History Main Topics  . Smoking status: Never Smoker   . Smokeless tobacco: Never Used  . Alcohol Use: No     Comment: prior use  . Drug Use: No  . Sexual Activity: Not on file   Other Topics Concern  . Not on file   Social History Narrative     reports that she has never smoked. She has never used smokeless tobacco. She reports that she does not drink alcohol or use illicit drugs.  Immunization History  Administered Date(s) Administered  . Influenza-Unspecified 04/26/2013, 05/08/2015  . Pneumococcal-Unspecified 06/07/2008    Allergies  Allergen Reactions  . Strawberry Extract   . Sulfonamide Derivatives     Medications: Patient's Medications  New Prescriptions   No medications on file  Previous Medications   AMINO ACIDS-PROTEIN HYDROLYS (FEEDING SUPPLEMENT, PRO-STAT SUGAR FREE 64,) LIQD    Take 30 mLs by mouth daily.   BACLOFEN (LIORESAL) 10 MG TABLET  Take 10 mg by mouth 3 (three) times daily. An 1 prn between 12a-3a   CALCIUM CARBONATE (TUMS - DOSED IN MG ELEMENTAL CALCIUM) 500 MG CHEWABLE TABLET    Chew 1 tablet by mouth daily.   CRANBERRY-VITAMIN C-INULIN (UTI-STAT) LIQD    Take 30 mLs by mouth daily.   ERGOCALCIFEROL (VITAMIN D2) 50000 UNITS CAPSULE    Take 50,000 Units by mouth every 30 (thirty) days.   FLUTICASONE (FLONASE) 50 MCG/ACT NASAL SPRAY    Place 1 spray into both nostrils 2 (two) times daily.   IPRATROPIUM-ALBUTEROL (DUONEB) 0.5-2.5 (3) MG/3ML SOLN    Take 3 mLs by nebulization every 6 (six) hours as needed.   LACTULOSE (CHRONULAC)  10 GM/15ML SOLUTION    Take 20 g by mouth every 12 (twelve) hours.   LIDOCAINE (LIDODERM) 5 %    Place 1 patch onto the skin daily. Remove & Discard patch within 12 hours or as directed by MD   LISINOPRIL (PRINIVIL,ZESTRIL) 2.5 MG TABLET    Take 5 mg by mouth daily.    LORATADINE-PSEUDOEPHEDRINE (CLARITIN-D 24-HOUR) 10-240 MG PER 24 HR TABLET    Take 1 tablet by mouth daily.   METOCLOPRAMIDE (REGLAN) 10 MG TABLET    Take 10 mg by mouth 4 (four) times daily.   MULTIPLE VITAMINS-MINERALS (MULTIVITAMIN WITH MINERALS) TABLET    Take 1 tablet by mouth daily.   OXYGEN-HELIUM IN    Inhale into the lungs.   PROMETHAZINE (PHENERGAN) 25 MG/ML INJECTION    Inject 25 mg into the muscle every 6 (six) hours as needed for nausea or vomiting. Or po   QUETIAPINE (SEROQUEL) 25 MG TABLET    Take 12.5 mg by mouth at bedtime.   SENNOSIDES-DOCUSATE SODIUM (SENOKOT-S) 8.6-50 MG TABLET    Take 1 tablet by mouth at bedtime.   TRAMADOL (ULTRAM) 50 MG TABLET    Take two tablets by mouth at bedtime for pain. Hold for excessive sedation   VENLAFAXINE (EFFEXOR) 75 MG TABLET    Take 75 mg by mouth daily.   ZOLPIDEM (AMBIEN) 5 MG TABLET    Take one tablet by mouth at bedtime for rest  Modified Medications   No medications on file  Discontinued Medications   No medications on file    Review of Systems  Unable to perform ROS: Psychiatric disorder    Filed Vitals:   08/19/15 1624  BP: 105/72  Pulse: 89  Temp: 98.1 F (36.7 C)  Weight: 158 lb 3.2 oz (71.759 kg)   There is no height on file to calculate BMI.  Physical Exam  Constitutional: She appears well-developed.  Lying in Geri chair in NAD, frail appearing  HENT:  Mouth/Throat: Oropharynx is clear and moist. No oropharyngeal exudate.  Eyes: Pupils are equal, round, and reactive to light. No scleral icterus.  Neck: Neck supple. Carotid bruit is not present. No tracheal deviation present.  Cardiovascular: Normal rate and intact distal pulses.  An irregularly  irregular rhythm present. Exam reveals no gallop and no friction rub.   Murmur heard.  Systolic murmur is present with a grade of 1/6  No LE edema b/l. no calf TTP.   Pulmonary/Chest: Effort normal and breath sounds normal. No stridor. No respiratory distress. She has no wheezes. She has no rales.  Abdominal: Soft. Bowel sounds are normal. She exhibits distension. She exhibits no mass. There is no hepatomegaly. There is tenderness (left flank but no bruising). There is no rebound and no guarding.  Genitourinary:  Suprapubic cath  intact and foley DTG clear yellow urine  Musculoskeletal: She exhibits edema and tenderness.  Multiple joint contractures in small and large joints  Lymphadenopathy:    She has no cervical adenopathy.  Neurological: She is alert.  Skin: Skin is warm and dry. No rash noted.  Psychiatric: She has a normal mood and affect. Her behavior is normal.     Labs reviewed: CBC Latest Ref Rng 07/16/2015 05/04/2010 05/28/2008  WBC - 6.6 8.2 10.9(H)  Hemoglobin 12.0 - 16.0 g/dL 11.9(A) 12.6 10.9(L)  Hematocrit 36 - 46 % 38 39.3 32.0(L)  Platelets 150 - 399 K/L 300 318 206    CMP Latest Ref Rng 07/16/2015 05/04/2010 05/28/2008  Glucose 70 - 99 mg/dL - 92 84  BUN 4 - 21 mg/dL 8 8 7   Creatinine 0.5 - 1.1 mg/dL 0.5 9.81 1.91  Sodium 478 - 147 mmol/L 138 137 138  Potassium 3.4 - 5.3 mmol/L 3.6 3.9 4.4 SLIGHT HEMOLYSIS  Chloride 96 - 112 mEq/L - 104 110  CO2 19 - 32 mEq/L - 25 22  Calcium 8.4 - 10.5 mg/dL - 9.0 8.6  Total Protein 6.0 - 8.3 g/dL - 8.3 -  Total Bilirubin 0.3 - 1.2 mg/dL - 0.3 -  Alkaline Phos 39 - 117 U/L - 69 -  AST 0 - 37 U/L - 28 -  ALT 0 - 35 U/L - 18 -     No results found.   Assessment/Plan   ICD-9-CM ICD-10-CM   1. Multiple sclerosis (HCC) - end stage 340 G35   2. Duodenal ileus, chronic 537.2 K31.5   3. Delusional disorder (HCC) 297.1 F22   4. Major depressive disorder, recurrent episode, mild (HCC) 296.31 F33.0   5. Type 2 diabetes  mellitus with peripheral angiopathy (HCC) - diet controlled 250.70 E11.51    443.81    6. Neurogenic bladder  S/p suprapubic cath and chronic indwelling foley 596.54 N31.9   7. Primary osteoarthritis involving multiple joints 715.09 M15.0   8. CVA, old, hemiparesis (HCC) 438.20 I69.359   9. Paraplegia (HCC) - due to #1 344.1 G82.20     [facility] provider to follow  Pt is medically stable on current tx plan. Continue current medications as ordered. PT/OT/ST as indicated. Will follow  Cohen Boettner S. Ancil Linsey  [facility] and [facility] [address] [place], [place] [address] [phone] Cell (Monday-Friday 8 AM - 5 PM) [phone] After 5 PM and follow prompts

## 2015-09-02 LAB — TSH: TSH: 1.58 u[IU]/mL (ref <=5.90)

## 2015-09-02 LAB — HEMOGLOBIN A1C: Hemoglobin A1C: 5.7

## 2015-09-02 LAB — BASIC METABOLIC PANEL
BUN: 10 mg/dL (ref 4–21)
CREATININE: 0.5 mg/dL (ref <=1.1)
GLUCOSE: 103 mg/dL
POTASSIUM: 4 mmol/L (ref 3.4–5.3)
SODIUM: 138 mmol/L (ref 137–147)

## 2015-09-20 LAB — CBC AND DIFFERENTIAL
HCT: 38 % (ref 36–46)
Hemoglobin: 12.1 g/dL (ref 12.0–16.0)
Platelets: 288 10*3/uL (ref 150–399)
WBC: 4.6 10^3/mL

## 2015-09-30 ENCOUNTER — Encounter: Payer: Self-pay | Admitting: Internal Medicine

## 2015-09-30 ENCOUNTER — Non-Acute Institutional Stay (SKILLED_NURSING_FACILITY): Payer: Medicare Other | Admitting: Internal Medicine

## 2015-09-30 DIAGNOSIS — I69359 Hemiplegia and hemiparesis following cerebral infarction affecting unspecified side: Secondary | ICD-10-CM

## 2015-09-30 DIAGNOSIS — E1151 Type 2 diabetes mellitus with diabetic peripheral angiopathy without gangrene: Secondary | ICD-10-CM

## 2015-09-30 DIAGNOSIS — R1312 Dysphagia, oropharyngeal phase: Secondary | ICD-10-CM | POA: Diagnosis not present

## 2015-09-30 DIAGNOSIS — M150 Primary generalized (osteo)arthritis: Secondary | ICD-10-CM

## 2015-09-30 DIAGNOSIS — N319 Neuromuscular dysfunction of bladder, unspecified: Secondary | ICD-10-CM | POA: Diagnosis not present

## 2015-09-30 DIAGNOSIS — F330 Major depressive disorder, recurrent, mild: Secondary | ICD-10-CM

## 2015-09-30 DIAGNOSIS — G35 Multiple sclerosis: Secondary | ICD-10-CM | POA: Diagnosis not present

## 2015-09-30 DIAGNOSIS — G8220 Paraplegia, unspecified: Secondary | ICD-10-CM

## 2015-09-30 DIAGNOSIS — M159 Polyosteoarthritis, unspecified: Secondary | ICD-10-CM

## 2015-09-30 NOTE — Progress Notes (Signed)
Patient ID: Gilmer Mor, female   DOB: 1936-01-29, 80 y.o.   MRN: [id number]    DATE: 09/30/15  Location:  [facility]    Place of Service: SNF (31)   Extended Emergency Contact Information Primary Emergency Contact: CORBITT,GLADYS Address: [address]          [place]  [address] Home Phone: [phone] Relation: None  Advanced Directive information Does patient have an advance directive?: Yes, Type of Advance Directive: Out of facility DNR (pink MOST or yellow form), Does patient want to make changes to advanced directive?: No - Patient declined  Chief Complaint  Patient presents with  . Medical Management of Chronic Issues    [facility]    HPI:  80 yo female long term resident seen today for f/u. She c/o dry lips and often applies vasoline. No other concerns. Appetite ok. Sleeping well. No nursing issues. No falls. She is a poor historian due to psych/mental status issues. Hx obtained from chart  MS/paraplegia/neurogenic bladder - spasm stable on baclofen. pain stable on tramadol and lidoderm patch. She takes lactulose and senokot for constipation. reglan for chronic ileus and  phenergan prn N/V. She uses duonebs and [place] O2 prn. She has a suprapubic cath. bladder spasms stable on oxybutynin. She has sicca syndrome. Dysphagia stable.   Seasonal allergy - She takes claritin d as needed and  flonase  MDD/insomnia/delusional d/o  - stable on effexor, ambien, seroquel, trazodone  CKD - resolved with last Cr 0.5  DM - diet controlled. A1c 5.7%  HTN - BP stable on lisinopril  Hx CVA - stable. Takes xeralto  Hx PE - takes xeralto  OA/chronic pain - stable on pain regimen including topical biofreeze  Past Medical History  Diagnosis Date  . Type II or unspecified type diabetes mellitus with ophthalmic manifestations, not stated as uncontrolled   . Delusional disorder(297.1)   . Multiple sclerosis (HCC)   . Paraplegia (HCC)   . Benign hypertensive heart  and kidney disease without heart failure and with chronic kidney disease stage I through stage IV, or unspecified   . Urinary-genital tract fistula, female   . Other functional disorders of intestine   . Chronic kidney disease, stage III (moderate)   . Acute appendicitis with peritoneal abscess   . Calculus of lower urinary tract, unspecified   . Sicca syndrome (HCC)   . Dysphagia, unspecified(787.20)   . Personal history of pulmonary embolism   . Neurogenic bladder, NOS   . Type II or unspecified type diabetes mellitus with neurological manifestations, not stated as uncontrolled   . Multiple sclerosis (HCC)   . Hip fracture (HCC)   . Tibial fracture     History reviewed. No pertinent past surgical history.  No care team member to display  Social History   Social History  . Marital Status: Divorced    Spouse Name: N/A  . Number of Children: N/A  . Years of Education: college gr   Occupational History  . teacher    Social History Main Topics  . Smoking status: Never Smoker   . Smokeless tobacco: Never Used  . Alcohol Use: No     Comment: prior use  . Drug Use: No  . Sexual Activity: Not on file   Other Topics Concern  . Not on file   Social History Narrative     reports that she has never smoked. She has never used smokeless tobacco. She reports that she does not drink alcohol  or use illicit drugs.  Immunization History  Administered Date(s) Administered  . Influenza-Unspecified 04/26/2013, 05/08/2015  . Pneumococcal-Unspecified 06/07/2008    Allergies  Allergen Reactions  . Strawberry Extract   . Sulfonamide Derivatives     Medications: Patient's Medications  New Prescriptions   No medications on file  Previous Medications   ACETAMINOPHEN (TYLENOL) 325 MG TABLET    Give 2 tablets by mouth three times ( 8a, 2p, 8p ) for pain   ACETAMINOPHEN (TYLENOL) 650 MG SUPPOSITORY    Place 650 mg rectally every 6 (six) hours as needed for fever.   AMINO  ACIDS-PROTEIN HYDROLYS (FEEDING SUPPLEMENT, PRO-STAT SUGAR FREE 64,) LIQD    Take 30 mLs by mouth daily.   BACLOFEN (LIORESAL) 10 MG TABLET    Take 10 mg by mouth 3 (three) times daily. An 1 prn between 12a-3a   ERGOCALCIFEROL (VITAMIN D2) 50000 UNITS CAPSULE    Give one capsule by mouth on the 14th.   FLUTICASONE (FLONASE) 50 MCG/ACT NASAL SPRAY    Place 1 spray into both nostrils 2 (two) times daily.   HYPROMELLOSE (ARTIFICIAL TEARS OP)    Apply to eye. Apply 1 drop to both eyes QID   IPRATROPIUM-ALBUTEROL (DUONEB) 0.5-2.5 (3) MG/3ML SOLN    Take 3 mLs by nebulization every 6 (six) hours as needed. For SOB   LACTULOSE 20 GM/30ML SOLN    Give 20 gm by mouth twice a day for constipation   LIDOCAINE-PRILOCAINE (EMLA) CREAM    Apply gernerous amount to porta cath site, cover with plastic wrap for 1.5-2 hours prior to accessing to aid in pain control   LISINOPRIL (PRINIVIL,ZESTRIL) 2.5 MG TABLET    Take 5 mg by mouth daily.    LORATADINE-PSEUDOEPHEDRINE (CLARITIN-D 24-HOUR) 10-240 MG PER 24 HR TABLET    Take 1 tablet by mouth daily.   MENTHOL, TOPICAL ANALGESIC, (BIOFREEZE) 4 % GEL    Apply topically. Apply to right knee twice daily for pain ( replacement for lido patch )   METOCLOPRAMIDE (REGLAN) 10 MG TABLET    Take 10 mg by mouth 4 (four) times daily.   MULTIPLE VITAMINS-MINERALS (MULTIVITAMIN WITH MINERALS) TABLET    Take 1 tablet by mouth daily.   OXYBUTYNIN (DITROPAN-XL) 5 MG 24 HR TABLET    Give 1 tablet  by mouth twice a day for bladder spasm   OXYGEN-HELIUM IN    Inhale into the lungs. O2 at 1-2 liters/min via nasal cannula prn for dyspnea  or hypoxia   OYSTER SHELL CALCIUM 500 MG TABS    Take by mouth. 1 tablet by mouth daily   PROMETHAZINE (PHENERGAN) 25 MG/ML INJECTION    Inject 25 mg into the muscle every 6 (six) hours as needed for nausea or vomiting. Or po   QUETIAPINE (SEROQUEL) 25 MG TABLET    Take 12.5 mg by mouth at bedtime.   RIVAROXABAN (XARELTO) 20 MG TABS TABLET    Take 20 mg by  mouth daily with supper. For HX ,OF DVT,PE AFIB   SENNOSIDES-DOCUSATE SODIUM (SENOKOT-S) 8.6-50 MG TABLET    Take 1 tablet by mouth at bedtime. Hold for loose stools   TRAMADOL (ULTRAM) 50 MG TABLET    Take two tablets by mouth at bedtime for pain. Hold for excessive sedation   TRAZODONE (DESYREL) 50 MG TABLET    Take 50 mg by mouth at bedtime. For sleep   VENLAFAXINE (EFFEXOR) 75 MG TABLET    Take 75 mg by mouth daily.   ZOLPIDEM (  AMBIEN) 5 MG TABLET    Take one tablet by mouth at bedtime for rest  Modified Medications   No medications on file  Discontinued Medications   CALCIUM CARBONATE (TUMS - DOSED IN MG ELEMENTAL CALCIUM) 500 MG CHEWABLE TABLET    Chew 1 tablet by mouth daily.   CRANBERRY-VITAMIN C-INULIN (UTI-STAT) LIQD    Take 30 mLs by mouth daily.   LACTULOSE (CHRONULAC) 10 GM/15ML SOLUTION    Take 20 g by mouth every 12 (twelve) hours.   LIDOCAINE (LIDODERM) 5 %    Place 1 patch onto the skin daily. Remove & Discard patch within 12 hours or as directed by MD    Review of Systems  Unable to perform ROS: Psychiatric disorder    Filed Vitals:   09/30/15 1103  BP: 135/81  Pulse: 98  Temp: 97.2 F (36.2 C)  TempSrc: Oral  Resp: 23  Height: 5\' 2"  (1.575 m)  Weight: 159 lb (72.122 kg)   Body mass index is 29.07 kg/(m^2).  Physical Exam  Constitutional: She appears well-developed.  Frail appearing sitting in geri chair in NAD  HENT:  Mouth/Throat: Oropharynx is clear and moist. No oropharyngeal exudate.  MMM  Eyes: Pupils are equal, round, and reactive to light. No scleral icterus.  Neck: Neck supple. Carotid bruit is not present. No tracheal deviation present. No thyromegaly present.  Cardiovascular: Normal rate, regular rhythm, normal heart sounds and intact distal pulses.  Exam reveals no gallop and no friction rub.   No murmur heard. B/l LE prevalon boot intact. Trace LE edema b/l. No calf TTP  Pulmonary/Chest: Effort normal and breath sounds normal. No stridor. No  respiratory distress. She has no wheezes. She has no rales.  Abdominal: Soft. Bowel sounds are normal. She exhibits no distension and no mass. There is no hepatomegaly. There is no tenderness. There is no rebound and no guarding.  Genitourinary:  suprapubic cath intact and foley DTG clear yellow urine  Musculoskeletal: She exhibits edema and tenderness.  RUE flexion contracture  Lymphadenopathy:    She has no cervical adenopathy.  Neurological: She is alert.  Skin: Skin is warm and dry. No rash noted.  Psychiatric: She has a normal mood and affect. Her behavior is normal.     Labs reviewed: Nursing Home on 09/30/2015  Component Date Value Ref Range Status  . Hemoglobin 09/20/2015 12.1  12.0 - 16.0 g/dL Final  . HCT 56/21/3086 38  36 - 46 % Final  . Platelets 09/20/2015 288  150 - 399 K/L Final  . WBC 09/20/2015 4.6   Final  . Glucose 09/02/2015 103   Final  . BUN 09/02/2015 10  4 - 21 mg/dL Final  . Creatinine 57/84/6962 0.5  .5 - 1.1 mg/dL Final  . Potassium 95/28/4132 4.0  3.4 - 5.3 mmol/L Final  . Sodium 09/02/2015 138  137 - 147 mmol/L Final  . Hemoglobin A1C 09/02/2015 5.7   Final  . TSH 09/02/2015 1.58  .41 - 5.90 uIU/mL Final  Nursing Home on 08/19/2015  Component Date Value Ref Range Status  . Hemoglobin 08/15/2014 11.9* 12.0 - 16.0 g/dL Final  . HCT 44/07/270 38  36 - 46 % Final  . Platelets 08/15/2014 300  150 - 399 K/L Final  . WBC 08/15/2014 6.6   Final  . Glucose 08/15/2014 114   Final  . BUN 08/15/2014 8  4 - 21 mg/dL Final  . Creatinine 53/66/4403 0.5  0.5 - 1.1 mg/dL Final  . Potassium  08/15/2014 3.6  3.4 - 5.3 mmol/L Final  . Sodium 08/15/2014 138  137 - 147 mmol/L Final    No results found.   Assessment/Plan   ICD-9-CM ICD-10-CM   1. Multiple sclerosis (HCC) 340 G35   2. Major depressive disorder, recurrent episode, mild (HCC) 296.31 F33.0   3. Type 2 diabetes mellitus with peripheral angiopathy (HCC) 250.70 E11.51    443.81    4. Primary  osteoarthritis involving multiple joints 715.09 M15.0   5. Paraplegia (HCC) 344.1 G82.20   6. CVA, old, hemiparesis (HCC) 438.20 I69.359   7. Neurogenic bladder 596.54 N31.9   8. Oropharyngeal dysphagia 787.22 R13.12     [facility]  provider to follow  Pt is medically stable on current tx plan. Continue current medications as ordered. PT/OT/ST as indicated. Will follow  Sayvon Arterberry S. Ancil Linsey  [facility] [address] [place], [place] [address] [phone] Cell (Monday-Friday 8 AM - 5 PM) [phone] After 5 PM and follow prompts

## 2015-10-31 ENCOUNTER — Encounter: Payer: Self-pay | Admitting: Internal Medicine

## 2015-10-31 ENCOUNTER — Non-Acute Institutional Stay (SKILLED_NURSING_FACILITY): Payer: Medicare Other | Admitting: Internal Medicine

## 2015-10-31 DIAGNOSIS — I69359 Hemiplegia and hemiparesis following cerebral infarction affecting unspecified side: Secondary | ICD-10-CM

## 2015-10-31 DIAGNOSIS — F22 Delusional disorders: Secondary | ICD-10-CM | POA: Diagnosis not present

## 2015-10-31 DIAGNOSIS — G35 Multiple sclerosis: Secondary | ICD-10-CM | POA: Diagnosis not present

## 2015-10-31 DIAGNOSIS — F330 Major depressive disorder, recurrent, mild: Secondary | ICD-10-CM | POA: Diagnosis not present

## 2015-10-31 DIAGNOSIS — M150 Primary generalized (osteo)arthritis: Secondary | ICD-10-CM

## 2015-10-31 DIAGNOSIS — E1151 Type 2 diabetes mellitus with diabetic peripheral angiopathy without gangrene: Secondary | ICD-10-CM | POA: Diagnosis not present

## 2015-10-31 DIAGNOSIS — M159 Polyosteoarthritis, unspecified: Secondary | ICD-10-CM

## 2015-10-31 DIAGNOSIS — Z9359 Other cystostomy status: Secondary | ICD-10-CM | POA: Diagnosis not present

## 2015-10-31 DIAGNOSIS — R1312 Dysphagia, oropharyngeal phase: Secondary | ICD-10-CM | POA: Diagnosis not present

## 2015-10-31 NOTE — Progress Notes (Signed)
Patient ID: [name], female   DOB: 04/21/1936, 80 y.o.   MRN: [id number]    DATE: 10/31/15  Location:  [facility]  Nursing Home Room Number: 105 A Place of Service: SNF (31)   Extended Emergency Contact Information Primary Emergency Contact: CORBITT,GLADYS Address: [address]          [place]  [address] Home Phone: [phone] Relation: None  Advanced Directive information Does patient have an advance directive?: Yes, Type of Advance Directive: Out of facility DNR (pink MOST or yellow form), Does patient want to make changes to advanced directive?: No - Patient declined  Chief Complaint  Patient presents with  . Medical Management of Chronic Issues; [facility]    HPI:  80 yo female long term resident seen today for f/u. She has no c/o. She now has a neck brace to giver her better neck posture when eating. It is helpful. No nursing issues. No falls. She is a poor historian due to psych dx. Hx obtained from chart  MS/paraplegia/neurogenic bladder - spasm stable on baclofen. pain stable on tramadol and lidoderm patch. She takes lactulose and senokot for constipation. reglan for chronic ileus and  phenergan prn N/V. She uses duonebs and [place] O2 prn. She has a suprapubic cath. bladder spasms stable on oxybutynin. She has sicca syndrome. Dysphagia stable.   Seasonal allergy - She takes claritin d as needed and  flonase  MDD/insomnia/delusional d/o  - stable on effexor, ambien, seroquel, trazodone  CKD - resolved with last Cr 0.5  DM - diet controlled. A1c 5.7%  HTN - BP stable on lisinopril  Hx CVA - stable. Takes xeralto  Hx PE - takes xeralto  OA/chronic pain - stable on pain regimen including topical biofreeze  Past Medical History  Diagnosis Date  . Type II or unspecified type diabetes mellitus with ophthalmic manifestations, not stated as uncontrolled   . Delusional disorder(297.1)   . Multiple sclerosis (HCC)   . Paraplegia (HCC)   . Benign  hypertensive heart and kidney disease without heart failure and with chronic kidney disease stage I through stage IV, or unspecified   . Urinary-genital tract fistula, female   . Other functional disorders of intestine   . Chronic kidney disease, stage III (moderate)   . Acute appendicitis with peritoneal abscess   . Calculus of lower urinary tract, unspecified   . Sicca syndrome (HCC)   . Dysphagia, unspecified(787.20)   . Personal history of pulmonary embolism   . Neurogenic bladder, NOS   . Type II or unspecified type diabetes mellitus with neurological manifestations, not stated as uncontrolled   . Multiple sclerosis (HCC)   . Hip fracture (HCC)   . Tibial fracture     History reviewed. No pertinent past surgical history.  No care team member to display  Social History   Social History  . Marital Status: Divorced    Spouse Name: N/A  . Number of Children: N/A  . Years of Education: college gr   Occupational History  . teacher    Social History Main Topics  . Smoking status: Never Smoker   . Smokeless tobacco: Never Used  . Alcohol Use: No     Comment: prior use  . Drug Use: No  . Sexual Activity: Not on file   Other Topics Concern  . Not on file   Social History Narrative     reports that she has never smoked. She has never used smokeless tobacco. She reports  that she does not drink alcohol or use illicit drugs.  Immunization History  Administered Date(s) Administered  . Influenza-Unspecified 04/26/2013, 05/08/2015  . Pneumococcal-Unspecified 06/07/2008    Allergies  Allergen Reactions  . Strawberry Extract   . Sulfonamide Derivatives     Medications: Patient's Medications  New Prescriptions   No medications on file  Previous Medications   ACETAMINOPHEN (TYLENOL) 325 MG TABLET    Give 2 tablets by mouth three times ( 8a, 2p, 8p ) for pain   ACETAMINOPHEN (TYLENOL) 650 MG SUPPOSITORY    Place 650 mg rectally every 6 (six) hours as needed for fever.     AMINO ACIDS-PROTEIN HYDROLYS (FEEDING SUPPLEMENT, PRO-STAT SUGAR FREE 64,) LIQD    Take 30 mLs by mouth daily.   BACLOFEN (LIORESAL) 10 MG TABLET    Take 10 mg by mouth 3 (three) times daily. An 1 prn between 12a-3a   CRANBERRY-VITAMIN C-INULIN (UTI-STAT) LIQD    Take 30 mLs by mouth daily.   ERGOCALCIFEROL (VITAMIN D2) 50000 UNITS CAPSULE    Give one capsule by mouth on the 14th.   FLUTICASONE (FLONASE) 50 MCG/ACT NASAL SPRAY    Place 1 spray into both nostrils 2 (two) times daily.   HEPARIN LOCK FLUSH (HEPARIN FLUSH, PORCINE,) 100 UNIT/ML INJECTION    Flush porta cath to Right upper chest with 10 ml NS 3 ml heparin using huber 22 g needle every month.   HYPROMELLOSE (ARTIFICIAL TEARS OP)    Apply to eye. Apply 1 drop to both eyes QID   IPRATROPIUM-ALBUTEROL (DUONEB) 0.5-2.5 (3) MG/3ML SOLN    Take 3 mLs by nebulization every 6 (six) hours as needed. For SOB   LACTULOSE 20 GM/30ML SOLN    Give 20 gm by mouth twice a day for constipation   LIDOCAINE-PRILOCAINE (EMLA) CREAM    Apply gernerous amount to porta cath site, cover with plastic wrap for 1.5-2 hours prior to accessing to aid in pain control   LISINOPRIL (PRINIVIL,ZESTRIL) 2.5 MG TABLET    Take 5 mg by mouth daily.    LORATADINE-PSEUDOEPHEDRINE (CLARITIN-D 24-HOUR) 10-240 MG PER 24 HR TABLET    Take 1 tablet by mouth daily.   LUBIPROSTONE (AMITIZA) 24 MCG CAPSULE    Take 1 capsule by mouth three times a week for constipation.   MENTHOL, TOPICAL ANALGESIC, (BIOFREEZE) 4 % GEL    Apply topically. Apply to right knee twice daily for pain ( replacement for lido patch )   METOCLOPRAMIDE (REGLAN) 10 MG TABLET    Take 10 mg by mouth 4 (four) times daily.   MIRABEGRON ER (MYRBETRIQ) 25 MG TB24 TABLET    Take 25 mg by mouth daily.   MULTIPLE VITAMINS-MINERALS (MULTIVITAMIN WITH MINERALS) TABLET    Take 1 tablet by mouth daily.   OXYBUTYNIN (DITROPAN-XL) 5 MG 24 HR TABLET    Give 1 tablet  by mouth twice a day for bladder spasm   OXYGEN-HELIUM IN     Inhale into the lungs. O2 at 1-2 liters/min via nasal cannula prn for dyspnea  or hypoxia   OYSTER SHELL CALCIUM 500 MG TABS    Take by mouth. 1 tablet by mouth daily   PROMETHAZINE (PHENERGAN) 25 MG/ML INJECTION    Inject 25 mg into the muscle every 6 (six) hours as needed for nausea or vomiting. Or po   QUETIAPINE (SEROQUEL) 25 MG TABLET    Take 12.5 mg by mouth at bedtime.   RIVAROXABAN (XARELTO) 20 MG TABS TABLET  Take 20 mg by mouth daily with supper. For HX ,OF DVT,PE AFIB   SENNOSIDES-DOCUSATE SODIUM (SENOKOT-S) 8.6-50 MG TABLET    Take 1 tablet by mouth at bedtime. Hold for loose stools   TRAMADOL (ULTRAM) 50 MG TABLET    Take two tablets by mouth at bedtime for pain. Hold for excessive sedation   TRAZODONE (DESYREL) 50 MG TABLET    Take 50 mg by mouth at bedtime. For sleep   VENLAFAXINE (EFFEXOR) 75 MG TABLET    Take 75 mg by mouth daily.  Modified Medications   No medications on file  Discontinued Medications   ZOLPIDEM (AMBIEN) 5 MG TABLET    Take one tablet by mouth at bedtime for rest    Review of Systems  Unable to perform ROS: Psychiatric disorder    Filed Vitals:   10/31/15 1120  BP: 128/86  Pulse: 80  Temp: 98.1 F (36.7 C)  TempSrc: Oral  Resp: 22  Height: 5\' 2"  (1.575 m)  Weight: 163 lb 3.2 oz (74.027 kg)   Body mass index is 29.84 kg/(m^2).  Physical Exam  Constitutional: She appears well-developed.  Frail appearing sitting in geri chair in NAD  HENT:  Mouth/Throat: Oropharynx is clear and moist. No oropharyngeal exudate.  MMM  Eyes: Pupils are equal, round, and reactive to light. No scleral icterus.  Neck: Neck supple. Carotid bruit is not present. No tracheal deviation present. No thyromegaly present.  Neck lateral contracture brace intact  Cardiovascular: Normal rate and intact distal pulses.  An irregularly irregular rhythm present. Exam reveals no gallop and no friction rub.   Murmur (1/6 SEM) heard. B/l LE prevalon boot intact. Trace LE edema  b/l. No calf TTP  Pulmonary/Chest: Effort normal and breath sounds normal. No stridor. No respiratory distress. She has no wheezes. She has no rales.  Abdominal: Soft. Bowel sounds are normal. She exhibits no distension and no mass. There is no hepatomegaly. There is no tenderness. There is no rebound and no guarding.  Genitourinary:  suprapubic cath intact and foley DTG clear yellow urine  Musculoskeletal: She exhibits edema and tenderness.  RUE flexion contracture - immobilizer intact  Lymphadenopathy:    She has no cervical adenopathy.  Neurological: She is alert.  paraplegic  Skin: Skin is warm and dry. No rash noted.  LE dsg c/d/i.   Psychiatric: She has a normal mood and affect. Her behavior is normal.     Labs reviewed: Nursing Home on 09/30/2015  Component Date Value Ref Range Status  . Hemoglobin 09/20/2015 12.1  12.0 - 16.0 g/dL Final  . HCT 97/58/8325 38  36 - 46 % Final  . Platelets 09/20/2015 288  150 - 399 K/L Final  . WBC 09/20/2015 4.6   Final  . Glucose 09/02/2015 103   Final  . BUN 09/02/2015 10  4 - 21 mg/dL Final  . Creatinine 49/82/6415 0.5  .5 - 1.1 mg/dL Final  . Potassium 83/03/4075 4.0  3.4 - 5.3 mmol/L Final  . Sodium 09/02/2015 138  137 - 147 mmol/L Final  . Hemoglobin A1C 09/02/2015 5.7   Final  . TSH 09/02/2015 1.58  .41 - 5.90 uIU/mL Final  Nursing Home on 08/19/2015  Component Date Value Ref Range Status  . Hemoglobin 08/15/2014 11.9* 12.0 - 16.0 g/dL Final  . HCT 80/88/1103 38  36 - 46 % Final  . Platelets 08/15/2014 300  150 - 399 K/L Final  . WBC 08/15/2014 6.6   Final  . Glucose 08/15/2014  114   Final  . BUN 08/15/2014 8  4 - 21 mg/dL Final  . Creatinine 16/04/9603 0.5  0.5 - 1.1 mg/dL Final  . Potassium 54/03/8118 3.6  3.4 - 5.3 mmol/L Final  . Sodium 08/15/2014 138  137 - 147 mmol/L Final    No results found.   Assessment/Plan   ICD-9-CM ICD-10-CM   1. Multiple sclerosis (HCC) 340 G35   2. Primary osteoarthritis involving  multiple joints 715.09 M15.0   3. CVA, old, hemiparesis (HCC) 438.20 I69.359   4. Chronic suprapubic catheter (HCC) V44.50 Z93.59   5. Type 2 diabetes mellitus with peripheral angiopathy (HCC) 250.70 E11.51    443.81    6. Major depressive disorder, recurrent episode, mild (HCC) 296.31 F33.0   7. Oropharyngeal dysphagia 787.22 R13.12   8. Delusional disorder (HCC) 297.1 F22     [facility] NP to follow  Pt is medically stable on current tx plan. Continue current medications as ordered. PT/OT/ST as indicated. Will follow  Arnelle Nale S. Ancil Linsey  [facility] [address] [place], [place] [address] [phone] Cell (Monday-Friday 8 AM - 5 PM) [phone] After 5 PM and follow prompts

## 2015-11-15 LAB — CBC AND DIFFERENTIAL
HCT: 41 % (ref 36–46)
Hemoglobin: 12.9 g/dL (ref 12.0–16.0)
Platelets: 328 10*3/uL (ref 150–399)
WBC: 6.9 10*3/mL

## 2015-11-15 LAB — HEPATIC FUNCTION PANEL
ALT: 14 U/L (ref 7–35)
AST: 17 U/L (ref 13–35)
Alkaline Phosphatase: 79 U/L (ref 25–125)
Bilirubin, Total: 0.3 mg/dL

## 2015-11-15 LAB — BASIC METABOLIC PANEL
BUN: 7 mg/dL (ref 4–21)
CREATININE: 0.5 mg/dL (ref 0.5–1.1)
GLUCOSE: 92 mg/dL
POTASSIUM: 4 mmol/L (ref 3.4–5.3)
Sodium: 139 mmol/L (ref 137–147)

## 2015-12-02 ENCOUNTER — Non-Acute Institutional Stay (SKILLED_NURSING_FACILITY): Payer: Medicare Other | Admitting: Internal Medicine

## 2015-12-02 ENCOUNTER — Encounter: Payer: Self-pay | Admitting: Internal Medicine

## 2015-12-02 DIAGNOSIS — M150 Primary generalized (osteo)arthritis: Secondary | ICD-10-CM | POA: Diagnosis not present

## 2015-12-02 DIAGNOSIS — G8220 Paraplegia, unspecified: Secondary | ICD-10-CM | POA: Diagnosis not present

## 2015-12-02 DIAGNOSIS — R1312 Dysphagia, oropharyngeal phase: Secondary | ICD-10-CM

## 2015-12-02 DIAGNOSIS — Z9359 Other cystostomy status: Secondary | ICD-10-CM

## 2015-12-02 DIAGNOSIS — F330 Major depressive disorder, recurrent, mild: Secondary | ICD-10-CM | POA: Diagnosis not present

## 2015-12-02 DIAGNOSIS — M3500 Sicca syndrome, unspecified: Secondary | ICD-10-CM | POA: Diagnosis not present

## 2015-12-02 DIAGNOSIS — E1151 Type 2 diabetes mellitus with diabetic peripheral angiopathy without gangrene: Secondary | ICD-10-CM

## 2015-12-02 DIAGNOSIS — I69359 Hemiplegia and hemiparesis following cerebral infarction affecting unspecified side: Secondary | ICD-10-CM | POA: Diagnosis not present

## 2015-12-02 DIAGNOSIS — G35 Multiple sclerosis: Secondary | ICD-10-CM

## 2015-12-02 DIAGNOSIS — F22 Delusional disorders: Secondary | ICD-10-CM | POA: Diagnosis not present

## 2015-12-02 DIAGNOSIS — M159 Polyosteoarthritis, unspecified: Secondary | ICD-10-CM

## 2015-12-02 NOTE — Progress Notes (Signed)
DATE: 12/02/15  Location:  [facility]  Nursing Home Room Number: 105 A Place of Service: SNF (31)   Extended Emergency Contact Information Primary Emergency Contact: CORBITT,GLADYS Address: [address]          [place]  [address] Home Phone: [phone] Relation: None  Advanced Directive information Does patient have an advance directive?: Yes, Type of Advance Directive: Out of facility DNR (pink MOST or yellow form), Does patient want to make changes to advanced directive?: No - Patient declined  Chief Complaint  Patient presents with  . Medical Management of Chronic Issues    Routine Visit; [facility]    HPI:  80 yo female long term resident seen today for f/u. She has no c/o. She now has a neck brace to giver her better neck posture when eating. It is helpful. No nursing issues. No falls. She is a poor historian due to psych dx. Hx obtained from chart  MS/paraplegia/neurogenic bladder - spasms stable on baclofen. pain stable on tramadol and lidoderm patch. She takes lactulose and senokot for constipation. reglan for chronic ileus and  phenergan prn N/V. She uses duonebs and [place] O2 prn. She has a suprapubic cath. bladder spasms stable on oxybutynin. She has sicca syndrome and uses topical lip balm frequently. Dysphagia stable. She gets nutritional supplements  Seasonal allergy - She takes claritin d as needed and  flonase  MDD/insomnia/delusional d/o  - stable on effexor, ambien, seroquel, trazodone  CKD - resolved with last Cr 0.5  DM - diet controlled. A1c 5.7%  HTN - BP stable on lisinopril  Hx CVA - stable. Takes xeralto  Hx PE - takes xeralto  OA/chronic pain - stable on pain regimen including topical biofreeze. She wears a neck brace to improve posture and improve meal experience  Past Medical History  Diagnosis Date  . Type II or unspecified type diabetes mellitus with ophthalmic manifestations, not stated as uncontrolled   . Delusional  disorder(297.1)   . Multiple sclerosis (HCC)   . Paraplegia (HCC)   . 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 Urinary-genital tract fistula, female   . Other functional disorders of intestine   . Chronic kidney disease, stage III (moderate)   . Acute appendicitis with peritoneal abscess   . Calculus of lower urinary tract, unspecified   . Sicca syndrome (HCC)   . Dysphagia, unspecified(787.20)   . Personal history of pulmonary embolism   . Neurogenic bladder, NOS   . Type II or unspecified type diabetes mellitus with neurological manifestations, not stated as uncontrolled   . Multiple sclerosis (HCC)   . Hip fracture (HCC)   . Tibial fracture     History reviewed. No pertinent past surgical history.  No care team member to display  Social History   Social History  . Marital Status: Divorced    Spouse Name: N/A  . Number of Children: N/A  . Years of Education: college gr   Occupational History  . teacher    Social History Main Topics  . Smoking status: Never Smoker   . Smokeless tobacco: Never Used  . Alcohol Use: No     Comment: prior use  . Drug Use: No  . Sexual Activity: Not on file   Other Topics Concern  . Not on file   Social History Narrative     reports that she has never  smoked. She has never used smokeless tobacco. She reports that she does not drink alcohol or use illicit drugs.  Immunization History  Administered Date(s) Administered  . Influenza-Unspecified 04/26/2013, 05/08/2015  . Pneumococcal-Unspecified 06/07/2008    Allergies  Allergen Reactions  . Strawberry Extract   . Sulfonamide Derivatives     Medications: Patient's Medications  New Prescriptions   No medications on file  Previous Medications   ACETAMINOPHEN (TYLENOL) 325 MG TABLET    Give 2 tablets by mouth three times ( 8a, 2p, 8p ) for pain   ACETAMINOPHEN (TYLENOL) 650  MG SUPPOSITORY    Place 650 mg rectally every 6 (six) hours as needed for fever.   AMINO ACIDS-PROTEIN HYDROLYS (FEEDING SUPPLEMENT, PRO-STAT SUGAR FREE 64,) LIQD    Take 30 mLs by mouth daily.   BACLOFEN (LIORESAL) 10 MG TABLET    Take 10 mg by mouth 3 (three) times daily. An 1 prn between 12a-3a   CRANBERRY-VITAMIN C-INULIN (UTI-STAT) LIQD    Take 30 mLs by mouth daily.   ERGOCALCIFEROL (VITAMIN D2) 50000 UNITS CAPSULE    Give one capsule by mouth on the 14th.   FLUTICASONE (FLONASE) 50 MCG/ACT NASAL SPRAY    Place 1 spray into both nostrils 2 (two) times daily.   HEPARIN LOCK FLUSH (HEPARIN FLUSH, PORCINE,) 100 UNIT/ML INJECTION    Flush porta cath to Right upper chest with 10 ml NS 3 ml heparin using huber 22 g needle every month.   HYPROMELLOSE (ARTIFICIAL TEARS OP)    Apply to eye. Apply 1 drop to both eyes QID   IPRATROPIUM-ALBUTEROL (DUONEB) 0.5-2.5 (3) MG/3ML SOLN    Take 3 mLs by nebulization every 6 (six) hours as needed. For SOB   LACTULOSE 20 GM/30ML SOLN    Give 20 gm by mouth twice a day for constipation   LIDOCAINE-PRILOCAINE (EMLA) CREAM    Apply gernerous amount to porta cath site, cover with plastic wrap for 1.5-2 hours prior to accessing to aid in pain control   LISINOPRIL (PRINIVIL,ZESTRIL) 2.5 MG TABLET    Take 5 mg by mouth daily.    LORATADINE-PSEUDOEPHEDRINE (CLARITIN-D 24-HOUR) 10-240 MG PER 24 HR TABLET    Take 1 tablet by mouth daily.   MENTHOL, TOPICAL ANALGESIC, (BIOFREEZE) 4 % GEL    Apply topically. Apply to right knee twice daily for pain ( replacement for lido patch )   METOCLOPRAMIDE (REGLAN) 10 MG TABLET    Take 10 mg by mouth 4 (four) times daily.   MIRABEGRON ER (MYRBETRIQ) 25 MG TB24 TABLET    Take 25 mg by mouth daily.   MULTIPLE VITAMINS-MINERALS (MULTIVITAMIN WITH MINERALS) TABLET    Take 1 tablet by mouth daily.   OXYBUTYNIN (DITROPAN-XL) 5 MG 24 HR TABLET    Give 1 tablet  by mouth twice a day for bladder spasm   OXYGEN-HELIUM IN    Inhale into the lungs.  O2 at 1-2 liters/min via nasal cannula prn for dyspnea  or hypoxia   OYSTER SHELL CALCIUM 500 MG TABS    Take by mouth. 1 tablet by mouth daily   PROMETHAZINE (PHENERGAN) 25 MG/ML INJECTION    Inject 25 mg into the muscle every 6 (six) hours as needed for nausea or vomiting. Or po   QUETIAPINE (SEROQUEL) 25 MG TABLET    Take 12.5 mg by mouth at bedtime.   RIVAROXABAN (XARELTO) 20 MG TABS TABLET    Take 20 mg by mouth daily with supper. For HX ,OF DVT,PE AFIB  SENNOSIDES-DOCUSATE SODIUM (SENOKOT-S) 8.6-50 MG TABLET    Take 1 tablet by mouth at bedtime. Hold for loose stools   TRAMADOL (ULTRAM) 50 MG TABLET    Take two tablets by mouth at bedtime for pain. Hold for excessive sedation   TRAZODONE (DESYREL) 50 MG TABLET    Take 50 mg by mouth at bedtime. For sleep   VENLAFAXINE (EFFEXOR) 75 MG TABLET    Take 75 mg by mouth daily.  Modified Medications   No medications on file  Discontinued Medications   LUBIPROSTONE (AMITIZA) 24 MCG CAPSULE    Take 1 capsule by mouth three times a week for constipation.    Review of Systems  Unable to perform ROS: Psychiatric disorder    Filed Vitals:   12/02/15 1508  BP: 121/81  Pulse: 82  Temp: 96.9 F (36.1 C)  TempSrc: Oral  Resp: 22  Height: 5\' 2"  (1.575 m)  Weight: 162 lb 6.4 oz (73.664 kg)   Body mass index is 29.7 kg/(m^2).  Physical Exam  Constitutional: She appears well-developed.  Frail appearing sitting in geri chair in NAD  HENT:  Mouth/Throat: Oropharynx is clear and moist. No oropharyngeal exudate.  MMM  Eyes: Pupils are equal, round, and reactive to light. No scleral icterus.  Neck: Neck supple. Carotid bruit is not present. No tracheal deviation present. No thyromegaly present.  Neck lateral contracture brace intact  Cardiovascular: Normal rate and intact distal pulses.  An irregularly irregular rhythm present. Exam reveals no gallop and no friction rub.   Murmur (1/6 SEM) heard. B/l LE prevalon boot intact. Trace LE edema  b/l. No calf TTP  Pulmonary/Chest: Effort normal and breath sounds normal. No stridor. No respiratory distress. She has no wheezes. She has no rales.  Abdominal: Soft. Bowel sounds are normal. She exhibits distension. She exhibits no mass. There is no hepatomegaly. There is no tenderness. There is no rebound and no guarding.  Genitourinary:  suprapubic cath intact and foley DTG very dark yellow urine with sediment in tube  Musculoskeletal: She exhibits edema and tenderness.  RUE flexion contracture - immobilizer intact  Lymphadenopathy:    She has no cervical adenopathy.  Neurological: She is alert.  paraplegic  Skin: Skin is warm and dry. No rash noted.  LE dsg c/d/i.   Psychiatric: She has a normal mood and affect. Her behavior is normal.     Labs reviewed: Nursing Home on 12/02/2015  Component Date Value Ref Range Status  . Hemoglobin 11/15/2015 12.9  12.0 - 16.0 g/dL Final  . HCT 63/07/6008 41  36 - 46 % Final  . Platelets 11/15/2015 328  150 - 399 K/L Final  . WBC 11/15/2015 6.9   Final  . Glucose 11/15/2015 92   Final  . BUN 11/15/2015 7  4 - 21 mg/dL Final  . Creatinine 93/23/5573 0.5  0.5 - 1.1 mg/dL Final  . Potassium 22/08/5425 4.0  3.4 - 5.3 mmol/L Final  . Sodium 11/15/2015 139  137 - 147 mmol/L Final  . Alkaline Phosphatase 11/15/2015 79  25 - 125 U/L Final  . ALT 11/15/2015 14  7 - 35 U/L Final  . AST 11/15/2015 17  13 - 35 U/L Final  . Bilirubin, Total 11/15/2015 0.3   Final  Nursing Home on 09/30/2015  Component Date Value Ref Range Status  . Hemoglobin 09/20/2015 12.1  12.0 - 16.0 g/dL Final  . HCT 01/17/7627 38  36 - 46 % Final  . Platelets 09/20/2015 288  150 -  399 K/L Final  . WBC 09/20/2015 4.6   Final  . Glucose 09/02/2015 103   Final  . BUN 09/02/2015 10  4 - 21 mg/dL Final  . Creatinine 16/04/9603 0.5  .5 - 1.1 mg/dL Final  . Potassium 54/03/8118 4.0  3.4 - 5.3 mmol/L Final  . Sodium 09/02/2015 138  137 - 147 mmol/L Final  . Hemoglobin A1C  09/02/2015 5.7   Final  . TSH 09/02/2015 1.58  .41 - 5.90 uIU/mL Final    No results found.   Assessment/Plan   ICD-9-CM ICD-10-CM   1. Primary osteoarthritis involving multiple joints 715.09 M15.0   2. Multiple sclerosis (HCC) 340 G35   3. Type 2 diabetes mellitus with peripheral angiopathy (HCC) 250.70 E11.51    443.81    4. Paraplegia (HCC) 344.1 G82.20   5. Chronic suprapubic catheter (HCC) due to neurogenic bladder V44.50 Z93.59   6. Delusional disorder (HCC) 297.1 F22   7. Sicca syndrome (HCC) 710.2 M35.00   8. Major depressive disorder, recurrent episode, mild (HCC) 296.31 F33.0   9. Oropharyngeal dysphagia 787.22 R13.12   10. CVA, old, hemiparesis (HCC) 438.20 Willie.[email or web address]     [facility] provider to follow  GOAL: short term rehab and d/c home when medically appropriate. Communicated with pt and nursing.  Lubna Stegeman S. Ancil Linsey  [facility] [address] [place], [place] [address] [phone] Cell (Monday-Friday 8 AM - 5 PM) [phone] After 5 PM and follow prompts

## 2015-12-30 ENCOUNTER — Encounter: Payer: Self-pay | Admitting: Internal Medicine

## 2015-12-30 ENCOUNTER — Non-Acute Institutional Stay (SKILLED_NURSING_FACILITY): Payer: Medicare Other | Admitting: Internal Medicine

## 2015-12-30 DIAGNOSIS — G8220 Paraplegia, unspecified: Secondary | ICD-10-CM

## 2015-12-30 DIAGNOSIS — K315 Obstruction of duodenum: Secondary | ICD-10-CM

## 2015-12-30 DIAGNOSIS — K5900 Constipation, unspecified: Secondary | ICD-10-CM

## 2015-12-30 DIAGNOSIS — F22 Delusional disorders: Secondary | ICD-10-CM

## 2015-12-30 DIAGNOSIS — Z9359 Other cystostomy status: Secondary | ICD-10-CM | POA: Diagnosis not present

## 2015-12-30 DIAGNOSIS — G35 Multiple sclerosis: Secondary | ICD-10-CM | POA: Diagnosis not present

## 2015-12-30 DIAGNOSIS — N183 Chronic kidney disease, stage 3 unspecified: Secondary | ICD-10-CM

## 2015-12-30 DIAGNOSIS — G4700 Insomnia, unspecified: Secondary | ICD-10-CM

## 2015-12-30 DIAGNOSIS — E118 Type 2 diabetes mellitus with unspecified complications: Secondary | ICD-10-CM

## 2015-12-30 DIAGNOSIS — K5909 Other constipation: Secondary | ICD-10-CM

## 2015-12-30 NOTE — Progress Notes (Signed)
DATE: 12/30/15  Location:  [facility]  Nursing Home Room Number: 105 A Place of Service: SNF (31)   Extended Emergency Contact Information Primary Emergency Contact: CORBITT,GLADYS Address: [address]          [place]  [address] Home Phone: [phone] Relation: None  Advanced Directive information Does patient have an advance directive?: Yes, Type of Advance Directive: Out of facility DNR (pink MOST or yellow form), Does patient want to make changes to advanced directive?: No - Patient declined  Chief Complaint  Patient presents with  . Medical Management of Chronic Issues    Routine Visit/ [facility]    HPI:  80 yo female long term resident seen today for f/u. She c/o constipation on lactulose. She is taking pain meds as ordered. She states she does not sleep well at night. No nursing issues. No falls. She is a poor historian due to psych dx. Hx obtained from chart  MS/paraplegia/neurogenic bladder - spasms stable on baclofen. pain stable on tramadol and lidoderm patch. She takes lactulose and senokot for constipation but not helping as she still feels constipated. She does have chronic duodenal ileus but no obstruction. reglan for chronic ileus and  phenergan prn N/V. She uses duonebs and [place] O2 prn. She has a suprapubic cath. bladder spasms stable on oxybutynin. She has sicca syndrome and uses topical lip balm frequently. Dysphagia stable. She gets nutritional supplements  Seasonal allergy - She takes claritin d as needed and  flonase  MDD/insomnia/delusional d/o  - mood and sleep stable on effexor, seroquel, trazodone. No reported delusions. No longer takes ambien  CKD - resolved with last Cr 0.5  DM - diet controlled. A1c 5.7%  HTN - BP stable on lisinopril  Hx CVA - stable. Takes xeralto  Hx PE - takes xeralto  OA/chronic pain - stable on pain regimen including topical biofreeze. She wears a neck brace to improve posture and improve meal  experience    Past Medical History  Diagnosis Date  . Type II or unspecified type diabetes mellitus with ophthalmic manifestations, not stated as uncontrolled   . Delusional disorder(297.1)   . Multiple sclerosis (HCC)   . Paraplegia (HCC)   . 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 Urinary-genital tract fistula, female   . Other functional disorders of intestine   . Chronic kidney disease, stage III (moderate)   . Acute appendicitis with peritoneal abscess   . Calculus of lower urinary tract, unspecified   . Sicca syndrome (HCC)   . Dysphagia, unspecified(787.20)   . Personal history of pulmonary embolism   . Neurogenic bladder, NOS   . Type II or unspecified type diabetes mellitus with neurological manifestations, not stated as uncontrolled   . Multiple sclerosis (HCC)   . Hip fracture (HCC)   . Tibial fracture     No past surgical history on file.  No care team member to display  Social History   Social History  . Marital Status: Divorced    Spouse Name: N/A  . Number of Children: N/A  . Years of Education: college gr   Occupational History  . teacher    Social History Main Topics  . Smoking status: Never Smoker   . Smokeless tobacco: Never Used  . Alcohol Use: No     Comment: prior use  . Drug Use: No  . Sexual Activity:  Not on file   Other Topics Concern  . Not on file   Social History Narrative     reports that she has never smoked. She has never used smokeless tobacco. She reports that she does not drink alcohol or use illicit drugs.  No family history on file. No family status information on file.    Immunization History  Administered Date(s) Administered  . Influenza-Unspecified 04/26/2013, 05/08/2015  . PPD Test 03/30/2012  . Pneumococcal-Unspecified 06/07/2008    Allergies  Allergen Reactions  . Strawberry Extract   . Sulfonamide  Derivatives     Medications: Patient's Medications  New Prescriptions   No medications on file  Previous Medications   ACETAMINOPHEN (TYLENOL) 325 MG TABLET    Give 2 tablets by mouth three times ( 8a, 2p, 8p ) for pain   ACETAMINOPHEN (TYLENOL) 650 MG SUPPOSITORY    Place 650 mg rectally every 6 (six) hours as needed for fever.   BACLOFEN (LIORESAL) 10 MG TABLET    Take 10 mg by mouth 3 (three) times daily. An 1 prn between 12a-3a   CRANBERRY-VITAMIN C-INULIN (UTI-STAT) LIQD    Take 30 mLs by mouth daily.   ERGOCALCIFEROL (VITAMIN D2) 50000 UNITS CAPSULE    Give one capsule by mouth on the 14th.   FLUTICASONE (FLONASE) 50 MCG/ACT NASAL SPRAY    Place 1 spray into both nostrils 2 (two) times daily.   HEPARIN LOCK FLUSH (HEPARIN FLUSH, PORCINE,) 100 UNIT/ML INJECTION    Flush porta cath to Right upper chest with 10 ml NS 3 ml heparin using huber 22 g needle every month.   HYPROMELLOSE (ARTIFICIAL TEARS OP)    Apply to eye. Apply 1 drop to both eyes QID   IPRATROPIUM-ALBUTEROL (DUONEB) 0.5-2.5 (3) MG/3ML SOLN    Take 3 mLs by nebulization every 6 (six) hours as needed. For SOB   LACTULOSE 20 GM/30ML SOLN    Give 20 gm by mouth twice a day for constipation   LIDOCAINE-PRILOCAINE (EMLA) CREAM    Apply gernerous amount to porta cath site, cover with plastic wrap for 1.5-2 hours prior to accessing to aid in pain control   LISINOPRIL (PRINIVIL,ZESTRIL) 2.5 MG TABLET    Take 5 mg by mouth daily.    LORATADINE-PSEUDOEPHEDRINE (CLARITIN-D 24-HOUR) 10-240 MG PER 24 HR TABLET    Take 1 tablet by mouth daily.   LUBIPROSTONE (AMITIZA) 24 MCG CAPSULE    Take 24 mcg by mouth. Three times a week for constipation   MENTHOL, TOPICAL ANALGESIC, (BIOFREEZE) 4 % GEL    Apply topically. Apply to right knee daily for pain ( replacement for lido patch )   METOCLOPRAMIDE (REGLAN) 10 MG TABLET    Take 10 mg by mouth 4 (four) times daily.   MIRABEGRON ER (MYRBETRIQ) 25 MG TB24 TABLET    Take 25 mg by mouth daily.    MULTIPLE VITAMINS-MINERALS (MULTIVITAMIN WITH MINERALS) TABLET    Take 1 tablet by mouth daily.   OXYBUTYNIN (DITROPAN-XL) 5 MG 24 HR TABLET    Give 1 tablet  by mouth twice a day for bladder spasm   OXYGEN-HELIUM IN    Inhale into the lungs. O2 at 1-2 liters/min up to 4L/min  via nasal cannula prn for dyspnea  or hypoxia to keep stats >90%   OYSTER SHELL CALCIUM 500 MG TABS    Take by mouth. 1 tablet by mouth daily   QUETIAPINE (SEROQUEL) 25 MG TABLET    Take 12.5 mg by mouth at  bedtime.   RIVAROXABAN (XARELTO) 20 MG TABS TABLET    Take 20 mg by mouth daily with supper. For HX ,OF DVT,PE AFIB   SENNOSIDES-DOCUSATE SODIUM (SENOKOT-S) 8.6-50 MG TABLET    Take 1 tablet by mouth at bedtime. Hold for loose stools   TRAMADOL (ULTRAM) 50 MG TABLET    Take two tablets by mouth at bedtime for pain. Hold for excessive sedation   TRAZODONE (DESYREL) 50 MG TABLET    Take 50 mg by mouth at bedtime. For sleep   VENLAFAXINE (EFFEXOR) 75 MG TABLET    Take 75 mg by mouth daily.  Modified Medications   No medications on file  Discontinued Medications   GUAIFENESIN (MUCINEX) 600 MG 12 HR TABLET    Take 600 mg by mouth 2 (two) times daily. Reported on 12/30/2015    Review of Systems  Unable to perform ROS: Psychiatric disorder    Filed Vitals:   12/30/15 1232  BP: 108/67  Pulse: 82  Temp: 97.2 F (36.2 C)  TempSrc: Oral  Resp: 20  Height: 5\' 2"  (1.575 m)  Weight: 163 lb (73.936 kg)   Body mass index is 29.81 kg/(m^2).  Physical Exam  Constitutional: She appears well-developed.  Frail appearing sitting in geri chair in NAD  HENT:  Mouth/Throat: Oropharynx is clear and moist. No oropharyngeal exudate.  MMM  Eyes: Pupils are equal, round, and reactive to light. No scleral icterus.  Neck: Neck supple. Carotid bruit is not present. No tracheal deviation present. No thyromegaly present.  Neck lateral contracture brace intact  Cardiovascular: Normal rate and intact distal pulses.  An irregularly  irregular rhythm present. Exam reveals no gallop and no friction rub.   Murmur (1/6 SEM) heard. B/l LE prevalon boot intact. Trace LE edema b/l. No calf TTP. TED stockings intact b/l.  Pulmonary/Chest: Effort normal and breath sounds normal. No stridor. No respiratory distress. She has no wheezes. She has no rales.  Abdominal: Soft. Bowel sounds are normal. She exhibits distension. She exhibits no mass. There is no hepatomegaly. There is no tenderness. There is no rebound and no guarding.  Genitourinary:  suprapubic cath intact and foley DTG very dark yellow urine with sediment in tube  Musculoskeletal: She exhibits edema and tenderness.  RUE flexion contracture - immobilizer intact  Lymphadenopathy:    She has no cervical adenopathy.  Neurological: She is alert.  paraplegic  Skin: Skin is warm and dry. No rash noted.  LE dsg c/d/i.   Psychiatric: She has a normal mood and affect. Her behavior is normal.     Labs reviewed: Nursing Home on 12/02/2015  Component Date Value Ref Range Status  . Hemoglobin 11/15/2015 12.9  12.0 - 16.0 g/dL Final  . HCT 16/04/9603 41  36 - 46 % Final  . Platelets 11/15/2015 328  150 - 399 K/L Final  . WBC 11/15/2015 6.9   Final  . Glucose 11/15/2015 92   Final  . BUN 11/15/2015 7  4 - 21 mg/dL Final  . Creatinine 54/03/8118 0.5  0.5 - 1.1 mg/dL Final  . Potassium 14/78/2956 4.0  3.4 - 5.3 mmol/L Final  . Sodium 11/15/2015 139  137 - 147 mmol/L Final  . Alkaline Phosphatase 11/15/2015 79  25 - 125 U/L Final  . ALT 11/15/2015 14  7 - 35 U/L Final  . AST 11/15/2015 17  13 - 35 U/L Final  . Bilirubin, Total 11/15/2015 0.3   Final  Nursing Home on 09/30/2015  Component Date Value  Ref Range Status  . Hemoglobin 09/20/2015 12.1  12.0 - 16.0 g/dL Final  . HCT 48/88/9169 38  36 - 46 % Final  . Platelets 09/20/2015 288  150 - 399 K/L Final  . WBC 09/20/2015 4.6   Final  . Glucose 09/02/2015 103   Final  . BUN 09/02/2015 10  4 - 21 mg/dL Final  .  Creatinine 45/09/8880 0.5  .5 - 1.1 mg/dL Final  . Potassium 80/09/4915 4.0  3.4 - 5.3 mmol/L Final  . Sodium 09/02/2015 138  137 - 147 mmol/L Final  . Hemoglobin A1C 09/02/2015 5.7   Final  . TSH 09/02/2015 1.58  .41 - 5.90 uIU/mL Final    No results found.   Assessment/Plan   ICD-9-CM ICD-10-CM   1. Chronic constipation 564.00 K59.00   2. Duodenal ileus, chronic  537.2 K31.5   3. Multiple sclerosis (HCC) 340 G35   4. Paraplegia (HCC) 344.1 G82.20   5. Chronic suprapubic catheter (HCC) V44.50 Z93.59   6. Delusional disorder (HCC) 297.1 F22   7. DM type 2, controlled, with complication (HCC) 250.90 E11.8   8. Chronic kidney disease, stage III (moderate) 585.3 N18.3   9. Insomnia 780.52 G47.00    Start linzess po daily for constipation. STOP amitiza. T/c changing lactulose to prn dosing  Cont other meds as ordered  PT/OT/ST as indicated  Cont nutritional supplements as ordered  [facility] NP to follow  Will follow  Joffrey Kerce S. Ancil Linsey  [facility] and [facility] [address] [place], [place] [address] [phone] Cell (Monday-Friday 8 AM - 5 PM) [phone] After 5 PM and follow prompts

## 2016-02-06 ENCOUNTER — Non-Acute Institutional Stay (SKILLED_NURSING_FACILITY): Payer: Medicare Other | Admitting: Internal Medicine

## 2016-02-06 ENCOUNTER — Encounter: Payer: Self-pay | Admitting: Internal Medicine

## 2016-02-06 DIAGNOSIS — M150 Primary generalized (osteo)arthritis: Secondary | ICD-10-CM | POA: Diagnosis not present

## 2016-02-06 DIAGNOSIS — F22 Delusional disorders: Secondary | ICD-10-CM | POA: Diagnosis not present

## 2016-02-06 DIAGNOSIS — Z9359 Other cystostomy status: Secondary | ICD-10-CM

## 2016-02-06 DIAGNOSIS — H9202 Otalgia, left ear: Secondary | ICD-10-CM

## 2016-02-06 DIAGNOSIS — K5900 Constipation, unspecified: Secondary | ICD-10-CM | POA: Diagnosis not present

## 2016-02-06 DIAGNOSIS — F330 Major depressive disorder, recurrent, mild: Secondary | ICD-10-CM

## 2016-02-06 DIAGNOSIS — G35 Multiple sclerosis: Secondary | ICD-10-CM

## 2016-02-06 DIAGNOSIS — M159 Polyosteoarthritis, unspecified: Secondary | ICD-10-CM

## 2016-02-06 DIAGNOSIS — K5909 Other constipation: Secondary | ICD-10-CM

## 2016-02-06 DIAGNOSIS — G8220 Paraplegia, unspecified: Secondary | ICD-10-CM | POA: Diagnosis not present

## 2016-02-06 DIAGNOSIS — R310 Gross hematuria: Secondary | ICD-10-CM

## 2016-02-06 NOTE — Progress Notes (Signed)
Patient ID: Gilmer Mor, female   DOB: 03-02-1936, 80 y.o.   MRN: [id number]    DATE: 02/06/16  Location:  Nursing Home Location: [facility]  Nursing Home Room Number: 105 A Place of Service: SNF (31)   Extended Emergency Contact Information Primary Emergency Contact: CORBITT,GLADYS Address: [address]          [place]  [address] Home Phone: [phone] Relation: None  Advanced Directive information Does patient have an advance directive?: Yes, Type of Advance Directive: Out of facility DNR (pink MOST or yellow form), Does patient want to make changes to advanced directive?: No - Patient declined  Chief Complaint  Patient presents with  . Medical Management of Chronic Issues    Routine Visit/ [facility]    HPI:  80 yo female long term resident seen today for f/u. She c/o left otalgia. She has seen ENT in the past but unable to remove cerumen due to neck contracture angle. No hearing loss. No other concerns. No nursing issues. No falls. Appetite is excellent. She is sleeping well.  MS/paraplegia/neurogenic bladder - spasms stable on baclofen. pain stable on tramadol and lidoderm patch. She takes lactulose and senokot for constipation but not helping as she still feels constipated. She does have chronic duodenal ileus but no obstruction. reglan for chronic ileus and  phenergan prn N/V. She uses duonebs and [place] O2 prn. She has a suprapubic cath. bladder spasms stable on oxybutynin. She has sicca syndrome and uses topical lip balm frequently. Dysphagia stable. She gets nutritional supplements  Seasonal allergy - She takes claritin d as needed and  flonase  MDD/insomnia/delusional d/o  - mood and sleep stable on effexor, seroquel, trazodone. No reported delusions. No longer takes ambien  CKD - resolved with last Cr 0.5  DM - diet controlled. A1c 5.7%  HTN - BP stable on lisinopril  Hx CVA - stable. Takes xeralto  Hx PE - takes xeralto  OA/chronic pain - stable on  pain regimen including topical biofreeze. She wears a neck brace to improve posture and improve meal experience    Past Medical History  Diagnosis Date  . Type II or unspecified type diabetes mellitus with ophthalmic manifestations, not stated as uncontrolled   . Delusional disorder(297.1)   . Multiple sclerosis (HCC)   . Paraplegia (HCC)   . 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 Urinary-genital tract fistula, female   . Other functional disorders of intestine   . Chronic kidney disease, stage III (moderate)   . Acute appendicitis with peritoneal abscess   . Calculus of lower urinary tract, unspecified   . Sicca syndrome (HCC)   . Dysphagia, unspecified(787.20)   . Personal history of pulmonary embolism   . Neurogenic bladder, NOS   . Type II or unspecified type diabetes mellitus with neurological manifestations, not stated as uncontrolled   . Multiple sclerosis (HCC)   . Hip fracture (HCC)   . Tibial fracture     History reviewed. No pertinent past surgical history.  No care team member to display  Social History   Social History  . Marital Status: Divorced    Spouse Name: N/A  . Number of Children: N/A  . Years of Education: college gr   Occupational History  . teacher    Social History Main Topics  . Smoking status: Never Smoker   . Smokeless tobacco: Never Used  .  Alcohol Use: No     Comment: prior use  . Drug Use: No  . Sexual Activity: Not on file   Other Topics Concern  . Not on file   Social History Narrative     reports that she has never smoked. She has never used smokeless tobacco. She reports that she does not drink alcohol or use illicit drugs.  History reviewed. No pertinent family history. No family status information on file.    Immunization History  Administered Date(s) Administered  . Influenza-Unspecified 04/26/2013, 05/08/2015  .  PPD Test 03/30/2012  . Pneumococcal-Unspecified 06/07/2008    Allergies  Allergen Reactions  . Strawberry Extract   . Sulfonamide Derivatives     Medications: Patient's Medications  New Prescriptions   No medications on file  Previous Medications   ACETAMINOPHEN (TYLENOL) 325 MG TABLET    Give 2 tablets by mouth three times ( 8a, 2p, 8p ) for pain   ACETAMINOPHEN (TYLENOL) 650 MG SUPPOSITORY    Place 650 mg rectally every 6 (six) hours as needed for fever.   BACLOFEN (LIORESAL) 10 MG TABLET    Take 10 mg by mouth 3 (three) times daily. An 1 prn between 12a-3a   CRANBERRY-VITAMIN C-INULIN (UTI-STAT) LIQD    Take 30 mLs by mouth daily.   ERGOCALCIFEROL (VITAMIN D2) 50000 UNITS CAPSULE    Give one capsule by mouth on the 14th.   FLUTICASONE (FLONASE) 50 MCG/ACT NASAL SPRAY    Place 1 spray into both nostrils 2 (two) times daily.   GUAIFENESIN (MUCINEX) 600 MG 12 HR TABLET    Take 600 mg by mouth 2 (two) times daily.   HEPARIN LOCK FLUSH (HEPARIN FLUSH, PORCINE,) 100 UNIT/ML INJECTION    Flush porta cath to Right upper chest with 10 ml NS 3 ml heparin using huber 22 g needle every month.   HYPROMELLOSE (ARTIFICIAL TEARS OP)    Apply to eye. Apply 1 drop to both eyes QID   IPRATROPIUM-ALBUTEROL (DUONEB) 0.5-2.5 (3) MG/3ML SOLN    Take 3 mLs by nebulization every 6 (six) hours as needed. For SOB   LACTULOSE 20 GM/30ML SOLN    Give 20 gm by mouth twice a day for constipation   LIDOCAINE-PRILOCAINE (EMLA) CREAM    Apply gernerous amount to porta cath site, cover with plastic wrap for 1.5-2 hours prior to accessing to aid in pain control   LISINOPRIL (PRINIVIL,ZESTRIL) 2.5 MG TABLET    Take 5 mg by mouth daily.    LORATADINE-PSEUDOEPHEDRINE (CLARITIN-D 24-HOUR) 10-240 MG PER 24 HR TABLET    Take 1 tablet by mouth daily.   LUBIPROSTONE (AMITIZA) 24 MCG CAPSULE    Take 24 mcg by mouth. Three times a week for constipation   MENTHOL, TOPICAL ANALGESIC, (BIOFREEZE) 4 % GEL    Apply topically. Apply  to right knee daily for pain ( replacement for lido patch )   METOCLOPRAMIDE (REGLAN) 10 MG TABLET    Take 10 mg by mouth 4 (four) times daily.   MIRABEGRON ER (MYRBETRIQ) 25 MG TB24 TABLET    Take 25 mg by mouth daily.   MULTIPLE VITAMINS-MINERALS (MULTIVITAMIN WITH MINERALS) TABLET    Take 1 tablet by mouth daily.   OXYBUTYNIN (DITROPAN-XL) 5 MG 24 HR TABLET    Give 1 tablet  by mouth twice a day for bladder spasm   OXYGEN-HELIUM IN    Inhale into the lungs. O2 at 1-2 liters/min up to 4L/min  via nasal cannula prn for dyspnea  or hypoxia to keep stats >90%   OYSTER SHELL CALCIUM 500 MG TABS    Take by mouth. 1 tablet by mouth daily   QUETIAPINE (SEROQUEL) 25 MG TABLET    Take 12.5 mg by mouth at bedtime.   RIVAROXABAN (XARELTO) 20 MG TABS TABLET    Take 20 mg by mouth daily with supper. For HX ,OF DVT,PE AFIB   SENNOSIDES-DOCUSATE SODIUM (SENOKOT-S) 8.6-50 MG TABLET    Take 1 tablet by mouth at bedtime. Hold for loose stools   TRAMADOL (ULTRAM) 50 MG TABLET    Take two tablets by mouth at bedtime for pain. Hold for excessive sedation   TRAZODONE (DESYREL) 50 MG TABLET    Take 50 mg by mouth at bedtime. For sleep   VENLAFAXINE (EFFEXOR) 75 MG TABLET    Take 75 mg by mouth daily.  Modified Medications   No medications on file  Discontinued Medications   No medications on file    Review of Systems  Unable to perform ROS: Psychiatric disorder    Filed Vitals:   02/06/16 1041  BP: 116/74  Pulse: 88  Temp: 98.7 F (37.1 C)  TempSrc: Oral  Resp: 20  Height: 5\' 2"  (1.575 m)  Weight: 165 lb 3.2 oz (74.934 kg)   Body mass index is 30.21 kg/(m^2).  Physical Exam  Constitutional: She appears well-developed.  Frail appearing sitting in geri chair in NAD  HENT:  Mouth/Throat: Oropharynx is clear and moist. No oropharyngeal exudate.  MMM  Eyes: Pupils are equal, round, and reactive to light. No scleral icterus.  Neck: Neck supple. Carotid bruit is not present. No tracheal deviation  present. No thyromegaly present.  Neck lateral contracture brace intact  Cardiovascular: Normal rate and intact distal pulses.  An irregularly irregular rhythm present. Exam reveals no gallop and no friction rub.   Murmur (1/6 SEM) heard. B/l LE prevalon boot intact. Trace LE edema b/l. No calf TTP. TED stockings intact b/l.  Pulmonary/Chest: Effort normal and breath sounds normal. No stridor. No respiratory distress. She has no wheezes. She has no rales.  Abdominal: Soft. Bowel sounds are normal. She exhibits distension. She exhibits no mass. There is no hepatomegaly. There is no tenderness. There is no rebound and no guarding.  Suprapubic cath intact  Genitourinary:  suprapubic cath intact and foley DTG very dark bloody urine with sediment in tube  Musculoskeletal: She exhibits edema and tenderness.  RUE flexion contracture - immobilizer intact  Lymphadenopathy:    She has no cervical adenopathy.  Neurological: She is alert.  paraplegic  Skin: Skin is warm and dry. No rash noted.  LE dsg c/d/i.   Psychiatric: She has a normal mood and affect. Her behavior is normal.     Labs reviewed: Nursing Home on 12/02/2015  Component Date Value Ref Range Status  . Hemoglobin 11/15/2015 12.9  12.0 - 16.0 g/dL Final  . HCT 16/04/9603 41  36 - 46 % Final  . Platelets 11/15/2015 328  150 - 399 K/L Final  . WBC 11/15/2015 6.9   Final  . Glucose 11/15/2015 92   Final  . BUN 11/15/2015 7  4 - 21 mg/dL Final  . Creatinine 54/03/8118 0.5  0.5 - 1.1 mg/dL Final  . Potassium 14/78/2956 4.0  3.4 - 5.3 mmol/L Final  . Sodium 11/15/2015 139  137 - 147 mmol/L Final  . Alkaline Phosphatase 11/15/2015 79  25 - 125 U/L Final  . ALT 11/15/2015 14  7 - 35 U/L Final  .  AST 11/15/2015 17  13 - 35 U/L Final  . Bilirubin, Total 11/15/2015 0.3   Final    No results found.   Assessment/Plan   ICD-9-CM ICD-10-CM   1. Otalgia, left 388.70 H92.02   2. Multiple sclerosis (HCC) 340 G35   3. Primary  osteoarthritis involving multiple joints 715.09 M15.0   4. Chronic suprapubic catheter (HCC) V44.50 Z93.59   5. Major depressive disorder, recurrent episode, mild (HCC) 296.31 F33.0   6. Delusional disorder (HCC) 297.1 F22   7. Paraplegia (HCC) 344.1 G82.20   8. Chronic constipation 564.00 K59.00   9. Hematuria, gross 599.71 R31.0     Send UA, urine cx and s if not done already  T/c ENT eval for otalgia of it does not improve on its own. T/c debrox gtts in left ear  Cont current meds as ordered  Foley cath care as indicated  [facility] NP to follow  F/u with specialists as indicated  Will follow  Bonnie Roig S. Ancil Linsey  [facility] [address] [place], [place] [address] [phone] Cell (Monday-Friday 8 AM - 5 PM) [phone] After 5 PM and follow prompts

## 2016-03-05 ENCOUNTER — Encounter: Payer: Self-pay | Admitting: Internal Medicine

## 2016-03-05 ENCOUNTER — Non-Acute Institutional Stay (SKILLED_NURSING_FACILITY): Payer: Medicare Other | Admitting: Internal Medicine

## 2016-03-05 DIAGNOSIS — F22 Delusional disorders: Secondary | ICD-10-CM | POA: Diagnosis not present

## 2016-03-05 DIAGNOSIS — G35 Multiple sclerosis: Secondary | ICD-10-CM

## 2016-03-05 DIAGNOSIS — R739 Hyperglycemia, unspecified: Secondary | ICD-10-CM | POA: Diagnosis not present

## 2016-03-05 DIAGNOSIS — D638 Anemia in other chronic diseases classified elsewhere: Secondary | ICD-10-CM

## 2016-03-05 NOTE — Assessment & Plan Note (Signed)
Clinically stable without progression of the associated complications of MS

## 2016-03-05 NOTE — Assessment & Plan Note (Signed)
No anemia was found in February or March and she denies any bleeding dyscrasias despite being on Xarelto. No change indicated

## 2016-03-05 NOTE — Assessment & Plan Note (Addendum)
03/05/16 patient exhibited no delusional manifestations at this time. The patient is on multiple psychotropic agents I would recommend consultation with Neuropsychiatry to assess present polypharmacy.

## 2016-03-05 NOTE — Assessment & Plan Note (Signed)
09/02/15 A1c was nondiabetic at 5.7% on no diabetic medications

## 2016-03-05 NOTE — Progress Notes (Signed)
Facility Location:[facility]  Room Number: 105-A  Code Status: DNR   This is a nursing facility follow up of chronic medical diagnoses  Interim medical record and care since last [facility] visit was updated with review of diagnostic studies and change in clinical status since last visit were documented.  HPI: The patient is a long-term resident of this facility with multiple sclerosis, complicated by paraplegia, neurogenic bladder, and dysphagia. She has a chronic suprapubic catheter in place. Medical diagnoses include chronic constipation, major depressive disorder with delusional component, & chronic kidney disease ,stage 3. She is on polypharmacy which includes lactulose, Linzess, Reglan and Senokot S. Urologic medications include oxybutynin and Myrbetriq. She has a history of pulmonary embolus and is on Xarelto. . She is on antihistamine /pseudoephedrine combination. Psychotropic drugs include Seroquel, trazodone, and generic Effexor for depression with history of delusional disorder. Most recent labs on record were 11/15/15. Hepatorenal function was normal despite a history of stage III chronic renal disease. Her renal function had been stable 2/6-4/21. She exhibited no anemia despite a history of such   Review of systems: Active symptoms were denied including any extrinsic symptoms except for intermittent/recurrent pain in the right lower extremity. Constitutional: No fever,significant weight change, fatigue  Eyes: No redness, discharge, pain, vision change ENT/mouth: No nasal congestion,  purulent discharge, earache,change in hearing ,sore throat  Cardiovascular: No chest pain, palpitations,paroxysmal nocturnal dyspnea, claudication, edema  Respiratory: No cough, sputum production,hemoptysis, DOE , significant snoring,apnea  Gastrointestinal: No heartburn,dysphagia,abdominal pain, nausea / vomiting,rectal bleeding, melena,change in  bowels Genitourinary: No dysuria,hematuria, pyuria,  incontinence, nocturia Musculoskeletal: No joint stiffness, joint swelling, weakness,pain Dermatologic: No rash, pruritus, change in appearance of skin Neurologic: No dizziness,headache,syncope, seizures, numbness , tingling Psychiatric: She expresses regrets having [place] as she only has one sister remaining of her family. Endocrine: No change in hair/skin/ nails, excessive thirst, excessive hunger, excessive urination  Hematologic/lymphatic: No significant bruising, lymphadenopathy,abnormal bleeding Allergy/immunology: No itchy/ watery eyes, significant sneezing, urticaria, angioedema  Physical exam:  Pertinent or positive findings: Despite the devastating neuro muscular disease she is intelligent , articulate, and interactive. 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 She exhibits no spontaneous movement except for facial expression change. She can move the left upper extremity. Teeth reveal fractures, caries, and malformation. The right lacrimal gland is prominent. There is extensive alopecia over the crown. Torticollis is present with deviation of the head and neck to the left. Heart sounds are distant and irregular. Breath sounds are decreased. Abdomen is protuberant with decreased bowel sounds. There is a suprapubic catheter in place. She has nonpitting edema of the lower extremities. Lower extremities are in padded booties. The right upper extremity is in a brace, there are flexion contractures of the right hand Clubbing of the nailbeds is present. She has an irregular linear & a circular area of vitiligo over the dorsum of the left hand. There is hypopigmentation over the shins.  General appearance:Adequately nourished; no acute distress , increased work of breathing is present.   Lymphatic: No  lymphadenopathy about the head, neck, axilla . Eyes: No conjunctival inflammation is present. There is no scleral icterus. Ears:  External ear exam shows no significant lesions or deformities.   Nose:  External nasal examination shows no deformity or inflammation. Nasal mucosa are pink and moist without lesions ,exudates Oral exam:  lips and gums are healthy appearing. Neck:  No thyromegaly, masses, tenderness noted.    Heart:  No gallop, murmur, click, rub .  Lungs:Chest clear to auscultation without wheezes, rhonchi,rales , rubs. Abdomen:Bowel sounds are normal. Abdomen is soft and nontender with no organomegaly, hernias,masses. GU: deferred  Skin: Warm & dry w/o tenting. No significant lesions or rash.    See summary under each active problem in the Problem List with associated updated therapeutic plan

## 2016-03-05 NOTE — Patient Instructions (Signed)
Because of the cardiac risk the antihistamine/decongestant will be discontinued. Neuropsychiatric consultation would be appropriate to evaluate the polypharmacy psychotropic drug regimen.

## 2016-03-27 DEATH — deceased
